# Patient Record
Sex: Female | Born: 1980 | Race: White | Hispanic: No | Marital: Married | Smoking: Former smoker
Health system: Southern US, Community
[De-identification: ages and names within clinical notes are randomized; demographics above are authoritative.]

## PROBLEM LIST (undated history)

## (undated) DIAGNOSIS — K219 Gastro-esophageal reflux disease without esophagitis: Secondary | ICD-10-CM

## (undated) DIAGNOSIS — F419 Anxiety disorder, unspecified: Secondary | ICD-10-CM

## (undated) DIAGNOSIS — T8859XA Other complications of anesthesia, initial encounter: Secondary | ICD-10-CM

## (undated) DIAGNOSIS — T4145XA Adverse effect of unspecified anesthetic, initial encounter: Secondary | ICD-10-CM

## (undated) HISTORY — PX: OTHER SURGICAL HISTORY: SHX169

## (undated) HISTORY — PX: ABDOMINAL HYSTERECTOMY: SHX81

## (undated) HISTORY — PX: KNEE SURGERY: SHX244

---

## 2005-08-28 ENCOUNTER — Ambulatory Visit: Payer: Self-pay | Admitting: Internal Medicine

## 2013-08-23 ENCOUNTER — Ambulatory Visit: Payer: Self-pay | Admitting: Physician Assistant

## 2017-04-21 ENCOUNTER — Encounter
Admission: RE | Admit: 2017-04-21 | Discharge: 2017-04-21 | Disposition: A | Discharge status: Home / Self Care | Payer: 59 | Source: Ambulatory Visit | Attending: Unknown Physician Specialty | Admitting: Unknown Physician Specialty

## 2017-04-21 ENCOUNTER — Other Ambulatory Visit: Payer: Self-pay

## 2017-04-21 HISTORY — DX: Anxiety disorder, unspecified: F41.9

## 2017-04-21 HISTORY — DX: Adverse effect of unspecified anesthetic, initial encounter: T41.45XA

## 2017-04-21 HISTORY — DX: Other complications of anesthesia, initial encounter: T88.59XA

## 2017-04-21 NOTE — Patient Instructions (Signed)
  Your procedure is scheduled on: 04-29-17  Report to Same Day Surgery 2nd floor medical mall [facility] Entrance-take elevator on left to 2nd floor.  Check in with surgery information desk.) To find out your arrival time please call [phone] between 1PM - 3PM on 04-28-17  Remember: Instructions that are not followed completely may result in serious medical risk, up to and including death, or upon the discretion of your surgeon and anesthesiologist your surgery may need to be rescheduled.    _x___ 1. Do not eat food after midnight the night before your procedure. NO GUM CHEWING OR CANDY AFTER MIDNIGHT.  You may drink clear liquids up to 2 hours before you are scheduled to arrive at the hospital for your procedure.  Do not drink clear liquids within 2 hours of your scheduled arrival to the hospital.  Clear liquids include  --Water or Apple juice without pulp  --Clear carbohydrate beverage such as ClearFast or Gatorade  --Black Coffee or Clear Tea (No milk, no creamers, do not add anything to the coffee or Tea        __x__ 2. No Alcohol for 24 hours before or after surgery.   __x__3. No Smoking for 24 prior to surgery.   ____  4. Bring all medications with you on the day of surgery if instructed.    __x__ 5. Notify your doctor if there is any change in your medical condition     (cold, fever, infections).     Do not wear jewelry, make-up, hairpins, clips or nail polish.  Do not wear lotions, powders, or perfumes. You may wear deodorant.  Do not shave 48 hours prior to surgery. Men may shave face and neck.  Do not bring valuables to the hospital.    [facility] is not responsible for any belongings or valuables.               Contacts, dentures or bridgework may not be worn into surgery.  Leave your suitcase in the car. After surgery it may be brought to your room.  For patients admitted to the hospital, discharge time is determined by your treatment team.   Patients  discharged the day of surgery will not be allowed to drive home.  You will need someone to drive you home and stay with you the night of your procedure.       ____ Take anti-hypertensive listed below, cardiac, seizure, asthma, anti-reflux and psychiatric medicines. These include:  1. NONE  2.  3.  4.  5.  6.  ____Fleets enema or Magnesium Citrate as directed.   ____ Use CHG Soap or sage wipes as directed on instruction sheet   ____ Use inhalers on the day of surgery and bring to hospital day of surgery  ____ Stop Metformin and Janumet 2 days prior to surgery.    ____ Take 1/2 of usual insulin dose the night before surgery and none on the morning     surgery.   ____ Follow recommendations from Cardiologist, Pulmonologist or PCP regarding stopping Aspirin, Coumadin, Plavix ,Eliquis, Effient, or Pradaxa, and Pletal.  X____Stop Anti-inflammatories such as Advil, Aleve, Ibuprofen, Motrin, Naproxen, Naprosyn, Goodies powders or aspirin products NOW-OK to take Tylenol   ____ Stop supplements until after surgery.     ____ Bring C-Pap to the hospital.

## 2017-04-29 ENCOUNTER — Encounter: Admission: RE | Payer: Self-pay | Source: Ambulatory Visit

## 2017-04-29 ENCOUNTER — Ambulatory Visit: Admission: RE | Admit: 2017-04-29 | Payer: 59 | Source: Ambulatory Visit | Admitting: Unknown Physician Specialty

## 2017-04-29 SURGERY — THYROIDECTOMY
Anesthesia: Choice | Laterality: Right

## 2017-06-22 ENCOUNTER — Other Ambulatory Visit: Payer: Self-pay

## 2017-06-22 ENCOUNTER — Ambulatory Visit
Admission: EM | Admit: 2017-06-22 | Discharge: 2017-06-22 | Disposition: A | Discharge status: Home / Self Care | Payer: Managed Care, Other (non HMO) | Attending: Emergency Medicine | Admitting: Emergency Medicine

## 2017-06-22 DIAGNOSIS — J029 Acute pharyngitis, unspecified: Secondary | ICD-10-CM | POA: Diagnosis not present

## 2017-06-22 DIAGNOSIS — R0982 Postnasal drip: Secondary | ICD-10-CM

## 2017-06-22 LAB — RAPID INFLUENZA A&B ANTIGENS (ARMC ONLY)
INFLUENZA A (ARMC): NEGATIVE
INFLUENZA B (ARMC): NEGATIVE

## 2017-06-22 LAB — RAPID STREP SCREEN (MED CTR MEBANE ONLY): Streptococcus, Group A Screen (Direct): NEGATIVE

## 2017-06-22 MED ORDER — IBUPROFEN 600 MG PO TABS: 600.0000 mg | ORAL_TABLET | Freq: Four times a day (QID) | ORAL | 0 refills | Status: DC | PRN

## 2017-06-22 MED ORDER — OSELTAMIVIR PHOSPHATE 75 MG PO CAPS: 75.0000 mg | ORAL_CAPSULE | Freq: Two times a day (BID) | ORAL | 0 refills | Status: DC

## 2017-06-22 MED ORDER — IPRATROPIUM BROMIDE 0.06 % NA SOLN: 2.0000 | Freq: Four times a day (QID) | NASAL | 0 refills | Status: DC

## 2017-06-22 NOTE — ED Provider Notes (Signed)
HPI  SUBJECTIVE:  Margaret Bradley is a 37 y.o. female who presents with 2 days of chills, body aches, fatigue, severe sore throat.  She has been taking Tylenol every 4-6 hours with some improvement in her symptoms.  There are no aggravating factors.  She reports sinus pressure, but no sinus pain or other headache.  No neck stiffness, photophobia, abdominal pain, rash, voice changes, drooling, trismus.  No sensation of throat swelling shut, difficulty breathing.  She states that she noticed exudates on her tonsils today.  No urinary complaints.  She did not get a flu shot this year.  She states that her son had a viral syndrome last week.  No GERD or allergy symptoms.  She reports clear nasal congestion, rhinorrhea, extensive postnasal drip for the past week.  She reports an occasional cough productive of greenish mucus.  No wheezing, shortness of breath.  She reports sinus pressure, but no pain.  No upper dental pain.  She has been taking Mucinex with improvement in her symptoms for this.  Her sinus pressure is not associated with bending forward or lying down.  Past medical history negative for sinusitis, allergies, diabetes, hypertension, mono, asthma, eczema, COPD, immunocompromise.  Past medical history of right-sided thyroid nodule.  LMP: One month ago.  Denies possibility of being pregnant.  RSW:NIOEVOJ, Jeneen Rinks, MD   Past Medical History:  Diagnosis Date  . Anxiety    OCC- HAS XANAX BUT HAS NEVER TAKEN  . Complication of anesthesia   . PONV (postoperative nausea and vomiting)     Past Surgical History:  Procedure Laterality Date  . KNEE SURGERY    . OTHER SURGICAL HISTORY     EGG RETRIEVAL    History reviewed. No pertinent family history.  Social History   Tobacco Use  . Smoking status: Former Smoker    Packs/day: 0.25    Years: 8.00    Pack years: 2.00    Types: Cigarettes    Last attempt to quit: 04/21/2016    Years since quitting: 1.1  . Smokeless tobacco: Never Used   Substance Use Topics  . Alcohol use: Yes    Frequency: Never    Comment: OCC  . Drug use: Yes    Types: Marijuana    Comment: OCC    No current facility-administered medications for this encounter.   Current Outpatient Medications:  .  drospirenone-ethinyl estradiol (YAZ) 3-0.02 MG tablet, Take 1 tablet daily by mouth., Disp: , Rfl:  .  ibuprofen (ADVIL,MOTRIN) 600 MG tablet, Take 1 tablet (600 mg total) by mouth every 6 (six) hours as needed., Disp: 30 tablet, Rfl: 0 .  ipratropium (ATROVENT) 0.06 % nasal spray, Place 2 sprays into both nostrils 4 (four) times daily. 3-4 times/ day, Disp: 15 mL, Rfl: 0 .  Multiple Vitamin (MULTIVITAMIN) tablet, Take 1 tablet daily by mouth. WHEN SHE REMEMBERS, Disp: , Rfl:  .  oseltamivir (TAMIFLU) 75 MG capsule, Take 1 capsule (75 mg total) by mouth 2 (two) times daily. X 5 days, Disp: 10 capsule, Rfl: 0 .  vitamin C (ASCORBIC ACID) 500 MG tablet, Take 500 mg daily by mouth. ONLY TAKING DUE TO COLD SYMPTOMS, Disp: , Rfl:   No Known Allergies   ROS  As noted in HPI.   Physical Exam  BP 107/66 (BP Location: Left Arm)   Pulse 89   Temp 99.1 F (37.3 C) (Oral)   Resp 18   Ht 5\' 8"  (1.727 m)   Wt 157 lb (71.2 kg)  LMP 05/22/2017   SpO2 100%   Breastfeeding? No   BMI 23.87 kg/m   Constitutional: Well developed, well nourished, no acute distress Eyes:  EOMI, conjunctiva normal bilaterally HENT: Normocephalic, atraumatic,mucus membranes moist.  TMs normal bilaterally.  Clear rhinorrhea.  Erythematous, not swollen turbinates.  No sinus tenderness.  Extensive postnasal drip and cobblestoning.  Erythematous oropharynx, tonsils normal size.  Positive scant exudates.  Uvula midline. Neck: Positive tender cervical lymphadenopathy bilaterally.  Positive nontender thyroid nodule in the right. Respiratory: Normal inspiratory effort, lungs clear bilaterally good air movement Cardiovascular: Normal rate regular rhythm, no murmurs, rubs, gallops. GI:  nondistended, no tenderness.  No splenomegaly.   Skin: No rash, skin intact Musculoskeletal: no deformities Neurologic: Alert & oriented x 3, no focal neuro deficits Psychiatric: Speech and behavior appropriate   ED Course   Medications - No data to display  Orders Placed This Encounter  Procedures  . Rapid strep screen    Standing Status:   Standing    Number of Occurrences:   1  . Rapid Influenza A&B Antigens ([facility] only)    Standing Status:   Standing    Number of Occurrences:   1  . Culture, group A strep    Standing Status:   Standing    Number of Occurrences:   1  . Droplet precaution    Standing Status:   Standing    Number of Occurrences:   1    Results for orders placed or performed during the hospital encounter of 06/22/17 (from the past 24 hour(s))  Rapid strep screen     Status: None   Collection Time: 06/22/17  1:53 PM  Result Value Ref Range   Streptococcus, Group A Screen (Direct) NEGATIVE NEGATIVE  Rapid Influenza A&B Antigens ([facility] only)     Status: None   Collection Time: 06/22/17  1:53 PM  Result Value Ref Range   Influenza A ([facility]) NEGATIVE NEGATIVE   Influenza B ([facility]) NEGATIVE NEGATIVE   No results found.  ED Clinical Impression  Acute pharyngitis, unspecified etiology  Post-nasal drip   ED Assessment/Plan   Patient with postnasal drip, she does not have any evidence of a sinusitis at this point in time that would require antibiotics.  Home with saline nasal irrigation, she is to restart his nasal spray, will start her on Atrovent as well.  Rapid strep and flu are negative.  Throat culture sent.  She also has an influenza-like illness starting 2 days ago.  Doubt meningitis.  No evidence of otitis.  Her rapid strep was negative, although she does have some exudates on her tonsils.  We will send home with a wait-and-see prescription of Tamiflu, ibuprofen 600 mg take with 1 g of Tylenol 3-4 times a day, push fluids, rest, advised  Benadryl/Maalox mixture.  She will follow-up with her primary care physician as needed.  To the ER if she gets worse.  Discussed labs, MDM, plan and followup with patient. patient agrees with plan.   Meds ordered this encounter  Medications  . ipratropium (ATROVENT) 0.06 % nasal spray    Sig: Place 2 sprays into both nostrils 4 (four) times daily. 3-4 times/ day    Dispense:  15 mL    Refill:  0  . ibuprofen (ADVIL,MOTRIN) 600 MG tablet    Sig: Take 1 tablet (600 mg total) by mouth every 6 (six) hours as needed.    Dispense:  30 tablet    Refill:  0  . oseltamivir (TAMIFLU)  75 MG capsule    Sig: Take 1 capsule (75 mg total) by mouth 2 (two) times daily. X 5 days    Dispense:  10 capsule    Refill:  0    *This clinic note was created using [employer]. Therefore, there may be occasional mistakes despite careful proofreading.   ?   Melynda Ripple, MD 06/22/17 1429

## 2017-06-22 NOTE — Discharge Instructions (Signed)
your rapid strep was negative today, so we have sent off a throat culture.  We will contact you and call in the appropriate antibiotics if your culture comes back positive for an infection requiring antibiotic treatment.  Give [place] a working phone number.  1 gram of Tylenol and 600 mg ibuprofen together 3-4 times a day as needed for pain.  Make sure you drink plenty of extra fluids.  Some people find salt water gargles and  Traditional Medicinal's "Throat Coat" tea helpful. Take 5 mL of liquid Benadryl and 5 mL of Maalox. Mix it together, and then hold it in your mouth for as long as you can and then swallow. You may do this 4 times a day.  Consider starting the Tamiflu if you start having fevers.  You have 48-72 hours within the onset of symptoms to start it for maximal efficacy.  Start some saline nasal irrigation with a Nettie pot or Milta Deiters med sinus rinse in addition to restarting the Flonase and Atrovent. May continue mucinex or mucinex d for the nasal congestion.  Go to www.goodrx.com to look up your medications. This will give you a list of where you can find your prescriptions at the most affordable prices. Or ask the pharmacist what the cash price is, or if they have any other discount programs available to help make your medication more affordable. This can be less expensive than what you would pay with insurance.

## 2017-06-22 NOTE — ED Triage Notes (Signed)
One week of nasal drainage, sore throat, bodyaches, low energy. Pain 5/10. Hasn't checked her temp so unsure if she has a fever.

## 2017-06-25 ENCOUNTER — Telehealth: Payer: Self-pay | Admitting: *Deleted

## 2017-06-25 ENCOUNTER — Telehealth: Payer: Self-pay

## 2017-06-25 LAB — CULTURE, GROUP A STREP (THRC)

## 2017-06-25 NOTE — Telephone Encounter (Signed)
Patient called in reporting worsening symptoms of fever, sore throat, and severe sinus pressure since visit on 06/22/17. I informed the patient that her strep culture has not resulted at this time and that she will be called when results are available. Advised patient to follow up with PCP if symptoms become worse or do not improve. Patient reported having a visit scheduled with her PCP today.

## 2017-06-25 NOTE — Telephone Encounter (Signed)
Pt called stating she wanted her throat culture result I told her it was negative and she said she wasn't feeling better and if I was sure? She then requested to speak to a nurse and I let kim, RN speak with her regarding her concern.

## 2017-07-29 ENCOUNTER — Other Ambulatory Visit: Payer: Managed Care, Other (non HMO)

## 2017-08-26 ENCOUNTER — Encounter
Admission: RE | Admit: 2017-08-26 | Discharge: 2017-08-26 | Disposition: A | Discharge status: Home / Self Care | Payer: Managed Care, Other (non HMO) | Source: Ambulatory Visit | Attending: Unknown Physician Specialty | Admitting: Unknown Physician Specialty

## 2017-08-26 ENCOUNTER — Other Ambulatory Visit: Payer: Self-pay

## 2017-08-26 NOTE — Patient Instructions (Signed)
Your procedure is scheduled on: 09-02-17 TUESDAY Report to Same Day Surgery 2nd floor medical mall [facility] Entrance-take elevator on left to 2nd floor.  Check in with surgery information desk.) To find out your arrival time please call [phone] between 1PM - 3PM on 09-01-17 MONDAY  Remember: Instructions that are not followed completely may result in serious medical risk, up to and including death, or upon the discretion of your surgeon and anesthesiologist your surgery may need to be rescheduled.    _x___ 1. Do not eat food after midnight the night before your procedure. You may drink clear liquids up to 2 hours before you are scheduled to arrive at the hospital for your procedure.  Do not drink clear liquids within 2 hours of your scheduled arrival to the hospital.  Clear liquids include  --Water or Apple juice without pulp  --Clear carbohydrate beverage such as ClearFast or Gatorade  --Black Coffee or Clear Tea (No milk, no creamers, do not add anything to the coffee or Tea Type 1 and type 2 diabetics should only drink water.  No gum chewing or hard candies.     __x__ 2. No Alcohol for 24 hours before or after surgery.   __x__3. No Smoking or e-cigarettes for 24 prior to surgery.  Do not use any chewable tobacco products for at least 6 hour prior to surgery   ____  4. Bring all medications with you on the day of surgery if instructed.    __x__ 5. Notify your doctor if there is any change in your medical condition     (cold, fever, infections).    x___6. On the morning of surgery brush your teeth with toothpaste and water.  You may rinse your mouth with mouth wash if you wish.  Do not swallow any toothpaste or mouthwash.   Do not wear jewelry, make-up, hairpins, clips or nail polish.  Do not wear lotions, powders, or perfumes. You may wear deodorant.  Do not shave 48 hours prior to surgery. Men may shave face and neck.  Do not bring valuables to the hospital.    [facility] is not responsible for any belongings or valuables.               Contacts, dentures or bridgework may not be worn into surgery.  Leave your suitcase in the car. After surgery it may be brought to your room.  For patients admitted to the hospital, discharge time is determined by your treatment team.  _  Patients discharged the day of surgery will not be allowed to drive home.  You will need someone to drive you home and stay with you the night of your procedure.    Please read over the following fact sheets that you were given:   [facility] Preparing for Surgery and or MRSA Information   ____ Take anti-hypertensive listed below, cardiac, seizure, asthma,     anti-reflux and psychiatric medicines. These include:  1. NONE  2.  3.  4.  5.  6.  ____Fleets enema or Magnesium Citrate as directed.   ____ Use CHG Soap or sage wipes as directed on instruction sheet   ____ Use inhalers on the day of surgery and bring to hospital day of surgery  ____ Stop Metformin and Janumet 2 days prior to surgery.    ____ Take 1/2 of usual insulin dose the night before surgery and none on the morning surgery.   ____ Follow recommendations from Cardiologist, Pulmonologist or  PCP regarding  stopping Aspirin, Coumadin, Plavix ,Eliquis, Effient, or Pradaxa, and Pletal.  X____Stop Anti-inflammatories such as Advil, Aleve, Ibuprofen, Motrin, Naproxen, Naprosyn, Goodies powders or aspirin products NOW-OK to take Tylenol    ____ Stop supplements until after surgery.     ____ Bring C-Pap to the hospital.

## 2017-09-02 ENCOUNTER — Encounter
Admission: RE | Disposition: A | Discharge status: Home / Self Care | Payer: Self-pay | Source: Ambulatory Visit | Attending: Unknown Physician Specialty

## 2017-09-02 ENCOUNTER — Ambulatory Visit: Payer: Managed Care, Other (non HMO) | Admitting: Anesthesiology

## 2017-09-02 ENCOUNTER — Ambulatory Visit
Admission: RE | Admit: 2017-09-02 | Discharge: 2017-09-02 | Disposition: A | Discharge status: Home / Self Care | Payer: Managed Care, Other (non HMO) | Source: Ambulatory Visit | Attending: Unknown Physician Specialty | Admitting: Unknown Physician Specialty

## 2017-09-02 ENCOUNTER — Encounter: Payer: Self-pay | Admitting: *Deleted

## 2017-09-02 DIAGNOSIS — Z87891 Personal history of nicotine dependence: Secondary | ICD-10-CM | POA: Insufficient documentation

## 2017-09-02 DIAGNOSIS — E049 Nontoxic goiter, unspecified: Secondary | ICD-10-CM | POA: Insufficient documentation

## 2017-09-02 DIAGNOSIS — Z793 Long term (current) use of hormonal contraceptives: Secondary | ICD-10-CM | POA: Diagnosis not present

## 2017-09-02 DIAGNOSIS — Z79899 Other long term (current) drug therapy: Secondary | ICD-10-CM | POA: Insufficient documentation

## 2017-09-02 DIAGNOSIS — E0789 Other specified disorders of thyroid: Secondary | ICD-10-CM | POA: Diagnosis present

## 2017-09-02 HISTORY — PX: THYROIDECTOMY: SHX17

## 2017-09-02 LAB — URINE DRUG SCREEN, QUALITATIVE (ARMC ONLY)
Amphetamines, Ur Screen: NOT DETECTED
BARBITURATES, UR SCREEN: NOT DETECTED
BENZODIAZEPINE, UR SCRN: NOT DETECTED
Cannabinoid 50 Ng, Ur ~~LOC~~: POSITIVE — AB
Cocaine Metabolite,Ur ~~LOC~~: NOT DETECTED
MDMA (Ecstasy)Ur Screen: NOT DETECTED
METHADONE SCREEN, URINE: NOT DETECTED
OPIATE, UR SCREEN: NOT DETECTED
Phencyclidine (PCP) Ur S: NOT DETECTED
Tricyclic, Ur Screen: NOT DETECTED

## 2017-09-02 LAB — POCT PREGNANCY, URINE: Preg Test, Ur: NEGATIVE

## 2017-09-02 SURGERY — THYROIDECTOMY
Anesthesia: General | Laterality: Right

## 2017-09-02 MED ORDER — OXYCODONE HCL 5 MG/5ML PO SOLN
5.0000 mg | Freq: Once | ORAL | Status: AC | PRN
  Administered 2017-09-02: 5 mg via ORAL

## 2017-09-02 MED ORDER — DIPHENHYDRAMINE HCL 50 MG/ML IJ SOLN
INTRAMUSCULAR | Status: AC
  Filled 2017-09-02: qty 1

## 2017-09-02 MED ORDER — PROPOFOL 500 MG/50ML IV EMUL
INTRAVENOUS | Status: DC | PRN
  Administered 2017-09-02: 150 ug/kg/min via INTRAVENOUS

## 2017-09-02 MED ORDER — LACTATED RINGERS IV SOLN
INTRAVENOUS | Status: DC
  Administered 2017-09-02: 07:00:00 via INTRAVENOUS

## 2017-09-02 MED ORDER — HYDROMORPHONE HCL 1 MG/ML IJ SOLN
0.2500 mg | INTRAMUSCULAR | Status: DC | PRN
  Administered 2017-09-02 (×2): 0.5 mg via INTRAVENOUS

## 2017-09-02 MED ORDER — DIPHENHYDRAMINE HCL 50 MG/ML IJ SOLN
INTRAMUSCULAR | Status: DC | PRN
  Administered 2017-09-02: 12.5 mg via INTRAVENOUS

## 2017-09-02 MED ORDER — SUCCINYLCHOLINE CHLORIDE 20 MG/ML IJ SOLN
INTRAMUSCULAR | Status: DC | PRN
  Administered 2017-09-02: 140 mg via INTRAVENOUS

## 2017-09-02 MED ORDER — MIDAZOLAM HCL 2 MG/2ML IJ SOLN
INTRAMUSCULAR | Status: AC
  Filled 2017-09-02: qty 2

## 2017-09-02 MED ORDER — ONDANSETRON HCL 4 MG/2ML IJ SOLN
INTRAMUSCULAR | Status: AC
  Filled 2017-09-02: qty 2

## 2017-09-02 MED ORDER — DEXAMETHASONE SODIUM PHOSPHATE 10 MG/ML IJ SOLN
INTRAMUSCULAR | Status: DC | PRN
  Administered 2017-09-02: 5 mg via INTRAVENOUS

## 2017-09-02 MED ORDER — LIDOCAINE HCL (PF) 2 % IJ SOLN
INTRAMUSCULAR | Status: AC
  Filled 2017-09-02: qty 10

## 2017-09-02 MED ORDER — FENTANYL CITRATE (PF) 100 MCG/2ML IJ SOLN
INTRAMUSCULAR | Status: AC
  Filled 2017-09-02: qty 2

## 2017-09-02 MED ORDER — SUCCINYLCHOLINE CHLORIDE 20 MG/ML IJ SOLN
INTRAMUSCULAR | Status: AC
  Filled 2017-09-02: qty 1

## 2017-09-02 MED ORDER — DEXAMETHASONE SODIUM PHOSPHATE 10 MG/ML IJ SOLN
INTRAMUSCULAR | Status: AC
  Filled 2017-09-02: qty 1

## 2017-09-02 MED ORDER — REMIFENTANIL HCL 1 MG IV SOLR
INTRAVENOUS | Status: DC | PRN
  Administered 2017-09-02: .5 ug/kg/min via INTRAVENOUS

## 2017-09-02 MED ORDER — PROPOFOL 500 MG/50ML IV EMUL
INTRAVENOUS | Status: AC
Start: 2017-09-02 — End: 2017-09-02
  Filled 2017-09-02: qty 50

## 2017-09-02 MED ORDER — PHENYLEPHRINE HCL 10 MG/ML IJ SOLN
INTRAMUSCULAR | Status: DC | PRN
  Administered 2017-09-02: 100 ug via INTRAVENOUS

## 2017-09-02 MED ORDER — ONDANSETRON HCL 4 MG/2ML IJ SOLN
INTRAMUSCULAR | Status: DC | PRN
Start: 2017-09-02 — End: 2017-09-02
  Administered 2017-09-02: 4 mg via INTRAVENOUS

## 2017-09-02 MED ORDER — GLYCOPYRROLATE 0.2 MG/ML IJ SOLN
INTRAMUSCULAR | Status: DC | PRN
  Administered 2017-09-02 (×2): 0.1 mg via INTRAVENOUS

## 2017-09-02 MED ORDER — LIDOCAINE-EPINEPHRINE 1 %-1:100000 IJ SOLN
INTRAMUSCULAR | Status: AC
  Filled 2017-09-02: qty 1

## 2017-09-02 MED ORDER — FAMOTIDINE 20 MG PO TABS
20.0000 mg | ORAL_TABLET | Freq: Once | ORAL | Status: AC
  Administered 2017-09-02: 20 mg via ORAL

## 2017-09-02 MED ORDER — MIDAZOLAM HCL 2 MG/2ML IJ SOLN
INTRAMUSCULAR | Status: DC | PRN
Start: 2017-09-02 — End: 2017-09-02
  Administered 2017-09-02: 2 mg via INTRAVENOUS

## 2017-09-02 MED ORDER — FENTANYL CITRATE (PF) 100 MCG/2ML IJ SOLN
INTRAMUSCULAR | Status: DC | PRN
  Administered 2017-09-02: 100 ug via INTRAVENOUS

## 2017-09-02 MED ORDER — LIDOCAINE HCL (CARDIAC) 20 MG/ML IV SOLN
INTRAVENOUS | Status: DC | PRN
  Administered 2017-09-02: 100 mg via INTRAVENOUS

## 2017-09-02 MED ORDER — MEPERIDINE HCL 50 MG/ML IJ SOLN: 6.2500 mg | INTRAMUSCULAR | Status: DC | PRN

## 2017-09-02 MED ORDER — OXYCODONE HCL 5 MG PO TABS: 5.0000 mg | ORAL_TABLET | Freq: Once | ORAL | Status: AC | PRN

## 2017-09-02 MED ORDER — GLYCOPYRROLATE 0.2 MG/ML IJ SOLN
INTRAMUSCULAR | Status: AC
  Filled 2017-09-02: qty 1

## 2017-09-02 MED ORDER — REMIFENTANIL HCL 1 MG IV SOLR
INTRAVENOUS | Status: AC
  Filled 2017-09-02: qty 1000

## 2017-09-02 MED ORDER — PROPOFOL 10 MG/ML IV BOLUS
INTRAVENOUS | Status: DC | PRN
  Administered 2017-09-02: 180 mg via INTRAVENOUS

## 2017-09-02 MED ORDER — SODIUM CHLORIDE 0.9 % IV SOLN
INTRAVENOUS | Status: DC | PRN
  Administered 2017-09-02: 50 ug/min via INTRAVENOUS

## 2017-09-02 MED ORDER — LIDOCAINE-EPINEPHRINE 1 %-1:100000 IJ SOLN
INTRAMUSCULAR | Status: DC | PRN
  Administered 2017-09-02: 5 mL

## 2017-09-02 MED ORDER — EPHEDRINE SULFATE 50 MG/ML IJ SOLN
INTRAMUSCULAR | Status: AC
  Filled 2017-09-02: qty 1

## 2017-09-02 MED ORDER — PROPOFOL 500 MG/50ML IV EMUL
INTRAVENOUS | Status: AC
  Filled 2017-09-02: qty 50

## 2017-09-02 MED ORDER — FAMOTIDINE 20 MG PO TABS
ORAL_TABLET | ORAL | Status: AC
  Filled 2017-09-02: qty 1

## 2017-09-02 MED ORDER — OXYCODONE-ACETAMINOPHEN 10-325 MG PO TABS: 1.0000 | ORAL_TABLET | Freq: Four times a day (QID) | ORAL | 0 refills | Status: DC | PRN

## 2017-09-02 MED ORDER — HYDROMORPHONE HCL 1 MG/ML IJ SOLN
INTRAMUSCULAR | Status: DC
Start: 2017-09-02 — End: 2017-09-02
  Filled 2017-09-02: qty 1

## 2017-09-02 MED ORDER — PROMETHAZINE HCL 25 MG/ML IJ SOLN: 6.2500 mg | INTRAMUSCULAR | Status: DC | PRN

## 2017-09-02 MED ORDER — EPHEDRINE SULFATE 50 MG/ML IJ SOLN
INTRAMUSCULAR | Status: DC | PRN
  Administered 2017-09-02: 5 mg via INTRAVENOUS

## 2017-09-02 MED ORDER — OXYCODONE HCL 5 MG/5ML PO SOLN
ORAL | Status: AC
  Filled 2017-09-02: qty 5

## 2017-09-02 SURGICAL SUPPLY — 36 items
BLADE SURG 15 STRL LF DISP TIS (BLADE) ×1 IMPLANT
BLADE SURG 15 STRL SS (BLADE) ×2
CANISTER SUCT 1200ML W/VALVE (MISCELLANEOUS) ×3 IMPLANT
CORD BIP STRL DISP 12FT (MISCELLANEOUS) ×3 IMPLANT
DERMABOND ADVANCED (GAUZE/BANDAGES/DRESSINGS) ×2
DERMABOND ADVANCED .7 DNX12 (GAUZE/BANDAGES/DRESSINGS) ×1 IMPLANT
DRAIN TLS ROUND 10FR (DRAIN) IMPLANT
DRAPE MAG INST 16X20 L/F (DRAPES) ×3 IMPLANT
DRSG TEGADERM 2-3/8X2-3/4 SM (GAUZE/BANDAGES/DRESSINGS) ×3 IMPLANT
ELECT LARYNGEAL 6/7 (MISCELLANEOUS) ×3
ELECT LARYNGEAL 8/9 (MISCELLANEOUS)
ELECT REM PT RETURN 9FT ADLT (ELECTROSURGICAL) ×3
ELECTRODE LARYNGEAL 6/7 (MISCELLANEOUS) ×1 IMPLANT
ELECTRODE LARYNGEAL 8/9 (MISCELLANEOUS) IMPLANT
ELECTRODE REM PT RTRN 9FT ADLT (ELECTROSURGICAL) ×1 IMPLANT
FORCEPS JEWEL BIP 4-3/4 STR (INSTRUMENTS) ×3 IMPLANT
GLOVE BIO SURGEON STRL SZ7.5 (GLOVE) ×6 IMPLANT
GOWN STRL REUS W/ TWL LRG LVL3 (GOWN DISPOSABLE) ×3 IMPLANT
GOWN STRL REUS W/TWL LRG LVL3 (GOWN DISPOSABLE) ×6
HEMOSTAT SURGICEL 2X3 (HEMOSTASIS) ×3 IMPLANT
HOOK STAY BLUNT/RETRACTOR 5M (MISCELLANEOUS) ×3 IMPLANT
KIT TURNOVER KIT A (KITS) ×3 IMPLANT
LABEL OR SOLS (LABEL) IMPLANT
NS IRRIG 500ML POUR BTL (IV SOLUTION) ×3 IMPLANT
PACK HEAD/NECK (MISCELLANEOUS) ×3 IMPLANT
PROBE NEUROSIGN BIPOL (MISCELLANEOUS) ×1 IMPLANT
PROBE NEUROSIGN BIPOLAR (MISCELLANEOUS) ×2
SHEARS HARMONIC 9CM CVD (BLADE) ×3 IMPLANT
SPONGE KITTNER 5P (MISCELLANEOUS) ×3 IMPLANT
SPONGE XRAY 4X4 16PLY STRL (MISCELLANEOUS) ×3 IMPLANT
STAPLER SKIN PROX 35W (STAPLE) ×3 IMPLANT
SUT SILK 2 0 (SUTURE) ×2
SUT SILK 2 0 SH (SUTURE) ×3 IMPLANT
SUT SILK 2-0 18XBRD TIE 12 (SUTURE) ×1 IMPLANT
SUT VIC AB 4-0 RB1 18 (SUTURE) ×6 IMPLANT
SYSTEM CHEST DRAIN TLS 7FR (DRAIN) IMPLANT

## 2017-09-02 NOTE — Anesthesia Procedure Notes (Addendum)
Procedure Name: Intubation Date/Time: 09/02/2017 7:26 AM Performed by: Alphonsus Sias, MD Pre-anesthesia Checklist: Patient identified, Emergency Drugs available, Suction available, Patient being monitored and Timeout performed Patient Re-evaluated:Patient Re-evaluated prior to induction Oxygen Delivery Method: Circle system utilized Preoxygenation: Pre-oxygenation with 100% oxygen Induction Type: IV induction Ventilation: Mask ventilation without difficulty Laryngoscope Size: McGraph and 3 Grade View: Grade I Tube type: Oral Tube size: 7.0 mm Number of attempts: 1 Airway Equipment and Method: Stylet Placement Confirmation: ETT inserted through vocal cords under direct vision,  positive ETCO2 and breath sounds checked- equal and bilateral Secured at: 21 cm Tube secured with: Tape Dental Injury: Teeth and Oropharynx as per pre-operative assessment  Comments: ETT with neurosign monitor attached

## 2017-09-02 NOTE — Anesthesia Preprocedure Evaluation (Signed)
Anesthesia Evaluation  Patient identified by MRN, date of birth, ID band Patient awake    Reviewed: Allergy & Precautions, H&P , NPO status , reviewed documented beta blocker date and time   History of Anesthesia Complications (+) PONV and history of anesthetic complications  Airway Mallampati: II  TM Distance: >3 FB     Dental no notable dental hx. (+) Teeth Intact   Pulmonary former smoker,    Pulmonary exam normal        Cardiovascular negative cardio ROS Normal cardiovascular exam     Neuro/Psych PSYCHIATRIC DISORDERS Anxiety    GI/Hepatic Neg liver ROS, neg GERD  ,  Endo/Other  Thyroid Cyst  Renal/GU negative Renal ROS  negative genitourinary   Musculoskeletal negative musculoskeletal ROS (+)   Abdominal   Peds negative pediatric ROS (+)  Hematology negative hematology ROS (+)   Anesthesia Other Findings   Reproductive/Obstetrics negative OB ROS                             Anesthesia Physical Anesthesia Plan  ASA: II  Anesthesia Plan: General ETT   Post-op Pain Management:    Induction:   PONV Risk Score and Plan: 3 and Ondansetron and Midazolam  Airway Management Planned:   Additional Equipment:   Intra-op Plan:   Post-operative Plan:   Informed Consent: I have reviewed the patients History and Physical, chart, labs and discussed the procedure including the risks, benefits and alternatives for the proposed anesthesia with the patient or authorized representative who has indicated his/her understanding and acceptance.   Dental Advisory Given  Plan Discussed with: CRNA  Anesthesia Plan Comments:         Anesthesia Quick Evaluation

## 2017-09-02 NOTE — Op Note (Signed)
09/02/2017  8:31 AM    Margaret Bradley  [id number]   Pre-Op Dx: THYROID CYST/ MASS  Post-op Dx: SAME  Proc: Right hemithyroidectomy; laryngeal nerve monitoring 1 hour   Surg:  Roena Malady   Asst.:Juengel  Anes:  GOT  EBL:  Less than 20 cc  Comp:  None  Findings:  Approximately 4 cm cystic mass right lobe of thyroid  Procedure: Margaret Bradley was identified in the holding area taken the operating room placed in supine position. After general endotracheal anesthesia with a laryngeal monitoring endotracheal tube and incision line was marked over the midline in a natural skin crease. A local anesthetic of 1% lidocaine with 1-100,000 units of epinephrine was used to inject along the incision line a total of 5 cc was used. The neck was then prepped and draped sterilely. A 15 blade was used to incise down to and through the platysma muscle hemostasis was achieved using the Bovie cautery. The midline raphae was identified and opened using the Harmonic scalpel. There was a cystic mass identified in the right lobe of thyroid. The strap muscles were retracted laterally several small feeding vessels were divided using the Harmonic scalpel. The superior pole of the gland was isolated and divided using the Harmonic scalpel. The gland was then retracted medially there are multiple feeding vessels which were isolated and divided using the Harmonic scalpel. The superior and inferior parathyroid glands were identified and left intact on the vascular pedicles. The recurrent laryngeal nerve was identified in the tracheoesophageal groove this was stimulated dissected free from surrounding tissue and remained intact throughout the procedure. The gland was then retracted medially the inferior vasculature was isolated and divided using the Harmonic scalpel berry's ligament was identified and divided using Harmonic scalpel this allowed the gland to peel over the anterior wall the trachea. Just past midline the gland  was divided in the isthmus using the Harmonic scalpel. Thus removing the entire right lobe and the large cystic mass. The wounds and cups irrigated with saline there was no evidence of bleeding. The nerve was stimulated in the case and remained intact. Surgicel was then placed in the neurovascular beds the excision site was so dry felt that a drain was not needed. The strap muscles were reapproximated in the midline using 4-0 Vicryl the platysma layer was closed using 4-0 Vicryl the subcutaneous tissues were closed using 4-0 Vicryl and the skin was closed using Dermabond. The patient was return anesthesia where she was awakened in the operating room taken recovery room stable condition.  Cultures: None  Specimens: Right lobe thyroid  Dispo:   Good  Plan:  Discharge to home follow-up 1 week  Roena Malady  09/02/2017 8:31 AM

## 2017-09-02 NOTE — Anesthesia Post-op Follow-up Note (Signed)
Anesthesia QCDR form completed.        

## 2017-09-02 NOTE — Anesthesia Postprocedure Evaluation (Signed)
Anesthesia Post Note  Patient: Margaret Bradley  Procedure(s) Performed: RIGHT HEMI-THYROIDECTOMY (Right )  Patient location during evaluation: PACU Anesthesia Type: General Level of consciousness: awake and alert Pain management: pain level controlled Vital Signs Assessment: post-procedure vital signs reviewed and stable Respiratory status: spontaneous breathing, nonlabored ventilation and respiratory function stable Cardiovascular status: blood pressure returned to baseline and stable Postop Assessment: no apparent nausea or vomiting Anesthetic complications: no     Last Vitals:  Vitals:   09/02/17 0925 09/02/17 0943  BP: 105/74 113/62  Pulse: 64 65  Resp: 12 16  Temp: 37.3 C 36.8 C  SpO2: 95% 100%    Last Pain:  Vitals:   09/02/17 0943  TempSrc: Temporal  PainSc: 5                  Amaranta Mehl Harvie Heck

## 2017-09-02 NOTE — Transfer of Care (Signed)
Immediate Anesthesia Transfer of Care Note  Patient: Margaret Bradley  Procedure(s) Performed: RIGHT HEMI-THYROIDECTOMY (Right )  Patient Location: PACU  Anesthesia Type:General  Level of Consciousness: awake, alert  and oriented  Airway & Oxygen Therapy: Patient Spontanous Breathing and Patient connected to face mask oxygen  Post-op Assessment: Report given to RN and Post -op Vital signs reviewed and stable  Post vital signs: Reviewed and stable  Last Vitals:  Vitals Value Taken Time  BP 121/78 09/02/2017  8:40 AM  Temp 36.1 C 09/02/2017  8:40 AM  Pulse 86 09/02/2017  8:42 AM  Resp 12 09/02/2017  8:42 AM  SpO2 100 % 09/02/2017  8:42 AM  Vitals shown include unvalidated device data.  Last Pain:  Vitals:   09/02/17 0840  TempSrc: Tympanic  PainSc:          Complications: No apparent anesthesia complications

## 2017-09-02 NOTE — H&P (Signed)
The patient's history has been reviewed, patient examined, no change in status, stable for surgery.  Questions were answered to the patients satisfaction.  

## 2017-09-02 NOTE — Discharge Instructions (Signed)

## 2017-09-04 LAB — SURGICAL PATHOLOGY

## 2018-04-10 ENCOUNTER — Other Ambulatory Visit: Payer: Managed Care, Other (non HMO)

## 2018-05-05 ENCOUNTER — Encounter
Admission: RE | Admit: 2018-05-05 | Discharge: 2018-05-05 | Disposition: A | Discharge status: Home / Self Care | Payer: Managed Care, Other (non HMO) | Source: Ambulatory Visit | Attending: Obstetrics & Gynecology | Admitting: Obstetrics & Gynecology

## 2018-05-05 ENCOUNTER — Other Ambulatory Visit: Payer: Self-pay

## 2018-05-05 HISTORY — DX: Gastro-esophageal reflux disease without esophagitis: K21.9

## 2018-05-05 NOTE — Patient Instructions (Signed)
Your procedure is scheduled on: 05-15-18 FRIDAY Report to Same Day Surgery 2nd floor medical mall [facility] Entrance-take elevator on left to 2nd floor.  Check in with surgery information desk.) To find out your arrival time please call [phone] between 1PM - 3PM on 05-14-18 THURSDAY  Remember: Instructions that are not followed completely may result in serious medical risk, up to and including death, or upon the discretion of your surgeon and anesthesiologist your surgery may need to be rescheduled.    _x___ 1. Do not eat food after midnight the night before your procedure. NO GUM OR CANDY AFTER MIDNIGHT.  You may drink clear liquids up to 2 hours before you are scheduled to arrive at the hospital for your procedure.  Do not drink clear liquids within 2 hours of your scheduled arrival to the hospital.  Clear liquids include  --Water or Apple juice without pulp  --Clear carbohydrate beverage such as ClearFast or Gatorade  --Black Coffee or Clear Tea (No milk, no creamers, do not add anything to the coffee or Tea   ____Ensure clear carbohydrate drink on the way to the hospital for bariatric patients  ____Ensure clear carbohydrate drink 3 hours before surgery for Dr Dwyane Luo patients if physician instructed.     __x__ 2. No Alcohol for 24 hours before or after surgery.   __x__3. No Smoking or e-cigarettes for 24 prior to surgery.  Do not use any chewable tobacco products for at least 6 hour prior to surgery   ____  4. Bring all medications with you on the day of surgery if instructed.    __x__ 5. Notify your doctor if there is any change in your medical condition     (cold, fever, infections).    x___6. On the morning of surgery brush your teeth with toothpaste and water.  You may rinse your mouth with mouth wash if you wish.  Do not swallow any toothpaste or mouthwash.   Do not wear jewelry, make-up, hairpins, clips or nail polish.  Do not wear lotions, powders, or perfumes.  You may wear deodorant.  Do not shave 48 hours prior to surgery. Men may shave face and neck.  Do not bring valuables to the hospital.    [facility] is not responsible for any belongings or valuables.               Contacts, dentures or bridgework may not be worn into surgery.  Leave your suitcase in the car. After surgery it may be brought to your room.  For patients admitted to the hospital, discharge time is determined by your treatment team.  _  Patients discharged the day of surgery will not be allowed to drive home.  You will need someone to drive you home and stay with you the night of your procedure.    Please read over the following fact sheets that you were given:   [facility] Preparing for Surgery   ____ Take anti-hypertensive listed below, cardiac, seizure, asthma, anti-reflux and psychiatric medicines. These include:  1. NONE  2.  3.  4.  5.  6.  ____Fleets enema or Magnesium Citrate as directed.   _x___ Use CHG Soap or sage wipes as directed on instruction sheet   ____ Use inhalers on the day of surgery and bring to hospital day of surgery  ____ Stop Metformin and Janumet 2 days prior to surgery.    ____ Take 1/2 of usual insulin dose the night before surgery and none  on the morning surgery.   ____ Follow recommendations from Cardiologist, Pulmonologist or PCP regarding stopping Aspirin, Coumadin, Plavix ,Eliquis, Effient, or Pradaxa, and Pletal.  X____Stop Anti-inflammatories such as Advil, Aleve, Ibuprofen, Motrin, Naproxen, Naprosyn, Goodies powders or aspirin products NOW-OK to take Tylenol   _X___ Stop supplements until after surgery-STOP VITAMIN C NOW-MAY RESUME AFTER SURGERY   ____ Bring C-Pap to the hospital.

## 2018-05-12 ENCOUNTER — Encounter
Admission: RE | Admit: 2018-05-12 | Discharge: 2018-05-12 | Disposition: A | Discharge status: Home / Self Care | Payer: Managed Care, Other (non HMO) | Source: Ambulatory Visit | Attending: Obstetrics & Gynecology | Admitting: Obstetrics & Gynecology

## 2018-05-12 DIAGNOSIS — D259 Leiomyoma of uterus, unspecified: Secondary | ICD-10-CM | POA: Insufficient documentation

## 2018-05-12 DIAGNOSIS — Z01818 Encounter for other preprocedural examination: Secondary | ICD-10-CM

## 2018-05-12 LAB — BASIC METABOLIC PANEL
Anion gap: 9 (ref 5–15)
BUN: 10 mg/dL (ref 6–20)
CHLORIDE: 102 mmol/L (ref 98–111)
CO2: 26 mmol/L (ref 22–32)
CREATININE: 0.84 mg/dL (ref 0.44–1.00)
Calcium: 8.7 mg/dL — ABNORMAL LOW (ref 8.9–10.3)
GFR calc non Af Amer: >60 mL/min (ref >60)
Glucose, Bld: 104 mg/dL — ABNORMAL HIGH (ref 70–99)
POTASSIUM: 4.1 mmol/L (ref 3.5–5.1)
Sodium: 137 mmol/L (ref 135–145)

## 2018-05-12 LAB — CBC
HEMATOCRIT: 43 % (ref 36.0–46.0)
Hemoglobin: 14 g/dL (ref 12.0–15.0)
MCH: 28.7 pg (ref 26.0–34.0)
MCHC: 32.6 g/dL (ref 30.0–36.0)
MCV: 88.3 fL (ref 80.0–100.0)
Platelets: 315 10*3/uL (ref 150–400)
RBC: 4.87 MIL/uL (ref 3.87–5.11)
RDW: 12.2 % (ref 11.5–15.5)
WBC: 9 10*3/uL (ref 4.0–10.5)
nRBC: 0 % (ref 0.0–0.2)

## 2018-05-14 ENCOUNTER — Encounter: Payer: Self-pay | Admitting: *Deleted

## 2018-05-14 LAB — TYPE AND SCREEN
ABO/RH(D): O POS
Antibody Screen: NEGATIVE

## 2018-05-14 MED ORDER — CEFAZOLIN SODIUM-DEXTROSE 2-4 GM/100ML-% IV SOLN
2.0000 g | Freq: Once | INTRAVENOUS | Status: AC
  Administered 2018-05-15 (×2): 2 g via INTRAVENOUS

## 2018-05-15 ENCOUNTER — Inpatient Hospital Stay
Admission: RE | Admit: 2018-05-15 | Discharge: 2018-05-16 | DRG: 743 | Disposition: A | Discharge status: Home / Self Care | Payer: Managed Care, Other (non HMO) | Attending: Obstetrics & Gynecology | Admitting: Obstetrics & Gynecology

## 2018-05-15 ENCOUNTER — Encounter
Admission: RE | Disposition: A | Discharge status: Home / Self Care | Payer: Self-pay | Source: Home / Self Care | Attending: Obstetrics & Gynecology

## 2018-05-15 ENCOUNTER — Other Ambulatory Visit: Payer: Self-pay

## 2018-05-15 ENCOUNTER — Inpatient Hospital Stay: Payer: Managed Care, Other (non HMO) | Admitting: Anesthesiology

## 2018-05-15 ENCOUNTER — Encounter: Payer: Self-pay | Admitting: *Deleted

## 2018-05-15 DIAGNOSIS — K4020 Bilateral inguinal hernia, without obstruction or gangrene, not specified as recurrent: Secondary | ICD-10-CM | POA: Diagnosis present

## 2018-05-15 DIAGNOSIS — D259 Leiomyoma of uterus, unspecified: Secondary | ICD-10-CM | POA: Diagnosis present

## 2018-05-15 DIAGNOSIS — Z87891 Personal history of nicotine dependence: Secondary | ICD-10-CM

## 2018-05-15 DIAGNOSIS — D252 Subserosal leiomyoma of uterus: Secondary | ICD-10-CM | POA: Diagnosis present

## 2018-05-15 DIAGNOSIS — Z23 Encounter for immunization: Secondary | ICD-10-CM | POA: Diagnosis not present

## 2018-05-15 DIAGNOSIS — Z90710 Acquired absence of both cervix and uterus: Secondary | ICD-10-CM | POA: Diagnosis present

## 2018-05-15 HISTORY — PX: ROBOTIC ASSISTED TOTAL HYSTERECTOMY WITH SALPINGECTOMY: SHX6679

## 2018-05-15 LAB — URINE DRUG SCREEN, QUALITATIVE (ARMC ONLY)
AMPHETAMINES, UR SCREEN: NOT DETECTED
BENZODIAZEPINE, UR SCRN: NOT DETECTED
Barbiturates, Ur Screen: NOT DETECTED
Cannabinoid 50 Ng, Ur ~~LOC~~: POSITIVE — AB
Cocaine Metabolite,Ur ~~LOC~~: NOT DETECTED
MDMA (Ecstasy)Ur Screen: NOT DETECTED
METHADONE SCREEN, URINE: NOT DETECTED
Opiate, Ur Screen: NOT DETECTED
PHENCYCLIDINE (PCP) UR S: NOT DETECTED
Tricyclic, Ur Screen: NOT DETECTED

## 2018-05-15 LAB — ABO/RH: ABO/RH(D): O POS

## 2018-05-15 LAB — POCT PREGNANCY, URINE: Preg Test, Ur: NEGATIVE

## 2018-05-15 SURGERY — ROBOTIC ASSISTED TOTAL HYSTERECTOMY WITH SALPINGECTOMY
Anesthesia: General | Laterality: Bilateral

## 2018-05-15 MED ORDER — PROMETHAZINE HCL 25 MG/ML IJ SOLN: 6.2500 mg | INTRAMUSCULAR | Status: DC | PRN

## 2018-05-15 MED ORDER — HEPARIN SODIUM (PORCINE) 5000 UNIT/ML IJ SOLN
INTRAMUSCULAR | Status: AC
  Filled 2018-05-15: qty 1

## 2018-05-15 MED ORDER — SUGAMMADEX SODIUM 200 MG/2ML IV SOLN
INTRAVENOUS | Status: DC | PRN
  Administered 2018-05-15: 150 mg via INTRAVENOUS

## 2018-05-15 MED ORDER — CEFAZOLIN SODIUM-DEXTROSE 2-4 GM/100ML-% IV SOLN
INTRAVENOUS | Status: AC
  Filled 2018-05-15: qty 100

## 2018-05-15 MED ORDER — PROPOFOL 10 MG/ML IV BOLUS
INTRAVENOUS | Status: DC | PRN
  Administered 2018-05-15: 150 mg via INTRAVENOUS

## 2018-05-15 MED ORDER — KETOROLAC TROMETHAMINE 30 MG/ML IJ SOLN
30.0000 mg | Freq: Once | INTRAMUSCULAR | Status: AC
  Administered 2018-05-15: 30 mg via INTRAVENOUS

## 2018-05-15 MED ORDER — ONDANSETRON HCL 4 MG/2ML IJ SOLN
INTRAMUSCULAR | Status: DC | PRN
  Administered 2018-05-15: 4 mg via INTRAVENOUS

## 2018-05-15 MED ORDER — ONDANSETRON HCL 4 MG/2ML IJ SOLN: 4.0000 mg | Freq: Four times a day (QID) | INTRAMUSCULAR | Status: DC | PRN

## 2018-05-15 MED ORDER — FAMOTIDINE 20 MG PO TABS
20.0000 mg | ORAL_TABLET | Freq: Once | ORAL | Status: AC
  Administered 2018-05-15: 20 mg via ORAL

## 2018-05-15 MED ORDER — LIDOCAINE HCL (PF) 2 % IJ SOLN
INTRAMUSCULAR | Status: AC
  Filled 2018-05-15: qty 10

## 2018-05-15 MED ORDER — HYDROMORPHONE HCL 1 MG/ML IJ SOLN: 0.2000 mg | INTRAMUSCULAR | Status: DC | PRN

## 2018-05-15 MED ORDER — DOCUSATE SODIUM 100 MG PO CAPS
100.0000 mg | ORAL_CAPSULE | Freq: Two times a day (BID) | ORAL | Status: DC
  Administered 2018-05-15 – 2018-05-16 (×3): 100 mg via ORAL
  Filled 2018-05-15 (×3): qty 1

## 2018-05-15 MED ORDER — ACETAMINOPHEN 500 MG PO TABS
ORAL_TABLET | ORAL | Status: AC
  Filled 2018-05-15: qty 2

## 2018-05-15 MED ORDER — SUGAMMADEX SODIUM 200 MG/2ML IV SOLN
INTRAVENOUS | Status: AC
  Filled 2018-05-15: qty 2

## 2018-05-15 MED ORDER — AMMONIA AROMATIC IN INHA
RESPIRATORY_TRACT | Status: AC
  Filled 2018-05-15: qty 10

## 2018-05-15 MED ORDER — BUPIVACAINE LIPOSOME 1.3 % IJ SUSP
INTRAMUSCULAR | Status: DC | PRN
  Administered 2018-05-15: 20 mL

## 2018-05-15 MED ORDER — OXYCODONE HCL 5 MG PO TABS: 10.0000 mg | ORAL_TABLET | ORAL | Status: DC | PRN

## 2018-05-15 MED ORDER — MIDAZOLAM HCL 2 MG/2ML IJ SOLN
INTRAMUSCULAR | Status: DC | PRN
  Administered 2018-05-15: 2 mg via INTRAVENOUS

## 2018-05-15 MED ORDER — GLYCOPYRROLATE 0.2 MG/ML IJ SOLN
INTRAMUSCULAR | Status: AC
  Filled 2018-05-15: qty 1

## 2018-05-15 MED ORDER — SODIUM CHLORIDE 0.9 % IV SOLN
50.0000 mL/h | INTRAVENOUS | Status: DC
  Administered 2018-05-15: 50 mL/h via INTRAVENOUS

## 2018-05-15 MED ORDER — OXYCODONE HCL 5 MG PO TABS: 5.0000 mg | ORAL_TABLET | Freq: Once | ORAL | Status: DC | PRN

## 2018-05-15 MED ORDER — LIDOCAINE HCL (CARDIAC) PF 100 MG/5ML IV SOSY
PREFILLED_SYRINGE | INTRAVENOUS | Status: DC | PRN
  Administered 2018-05-15: 80 mg via INTRAVENOUS

## 2018-05-15 MED ORDER — SEVOFLURANE IN SOLN
RESPIRATORY_TRACT | Status: AC
  Filled 2018-05-15: qty 250

## 2018-05-15 MED ORDER — FENTANYL CITRATE (PF) 100 MCG/2ML IJ SOLN
INTRAMUSCULAR | Status: DC | PRN
  Administered 2018-05-15: 25 ug via INTRAVENOUS
  Administered 2018-05-15 (×2): 50 ug via INTRAVENOUS

## 2018-05-15 MED ORDER — FAMOTIDINE 20 MG PO TABS
ORAL_TABLET | ORAL | Status: AC
  Filled 2018-05-15: qty 1

## 2018-05-15 MED ORDER — ACETAMINOPHEN 500 MG PO TABS
1000.0000 mg | ORAL_TABLET | Freq: Once | ORAL | Status: AC
  Administered 2018-05-15: 1000 mg via ORAL

## 2018-05-15 MED ORDER — KETOROLAC TROMETHAMINE 30 MG/ML IJ SOLN
INTRAMUSCULAR | Status: AC
  Filled 2018-05-15: qty 1

## 2018-05-15 MED ORDER — ROCURONIUM BROMIDE 50 MG/5ML IV SOLN
INTRAVENOUS | Status: AC
  Filled 2018-05-15: qty 1

## 2018-05-15 MED ORDER — DEXAMETHASONE SODIUM PHOSPHATE 10 MG/ML IJ SOLN
INTRAMUSCULAR | Status: AC
  Filled 2018-05-15: qty 1

## 2018-05-15 MED ORDER — HEPARIN SODIUM (PORCINE) 5000 UNIT/ML IJ SOLN
5000.0000 [IU] | Freq: Once | INTRAMUSCULAR | Status: AC
  Administered 2018-05-15: 5000 [IU] via SUBCUTANEOUS

## 2018-05-15 MED ORDER — LACTATED RINGERS IV SOLN
INTRAVENOUS | Status: DC
  Administered 2018-05-15: 07:00:00 via INTRAVENOUS

## 2018-05-15 MED ORDER — OXYCODONE HCL 5 MG PO TABS: 5.0000 mg | ORAL_TABLET | ORAL | Status: DC | PRN

## 2018-05-15 MED ORDER — BUPIVACAINE LIPOSOME 1.3 % IJ SUSP
INTRAMUSCULAR | Status: AC
  Filled 2018-05-15: qty 20

## 2018-05-15 MED ORDER — SILVER NITRATE-POT NITRATE 75-25 % EX MISC
CUTANEOUS | Status: AC
  Filled 2018-05-15: qty 4

## 2018-05-15 MED ORDER — CELECOXIB 200 MG PO CAPS
ORAL_CAPSULE | ORAL | Status: AC
  Filled 2018-05-15: qty 2

## 2018-05-15 MED ORDER — ACETAMINOPHEN 500 MG PO TABS
1000.0000 mg | ORAL_TABLET | Freq: Four times a day (QID) | ORAL | Status: DC
  Administered 2018-05-15 – 2018-05-16 (×4): 1000 mg via ORAL
  Filled 2018-05-15 (×4): qty 2

## 2018-05-15 MED ORDER — CELECOXIB 200 MG PO CAPS
400.0000 mg | ORAL_CAPSULE | Freq: Once | ORAL | Status: AC
  Administered 2018-05-15: 400 mg via ORAL

## 2018-05-15 MED ORDER — FENTANYL CITRATE (PF) 250 MCG/5ML IJ SOLN
INTRAMUSCULAR | Status: AC
  Filled 2018-05-15: qty 5

## 2018-05-15 MED ORDER — SUCCINYLCHOLINE CHLORIDE 20 MG/ML IJ SOLN
INTRAMUSCULAR | Status: AC
  Filled 2018-05-15: qty 1

## 2018-05-15 MED ORDER — CEFAZOLIN SODIUM 1 G IJ SOLR
INTRAMUSCULAR | Status: AC
  Filled 2018-05-15: qty 10

## 2018-05-15 MED ORDER — MIDAZOLAM HCL 2 MG/2ML IJ SOLN
INTRAMUSCULAR | Status: AC
  Filled 2018-05-15: qty 2

## 2018-05-15 MED ORDER — GABAPENTIN 300 MG PO CAPS
600.0000 mg | ORAL_CAPSULE | Freq: Once | ORAL | Status: AC
  Administered 2018-05-15: 600 mg via ORAL

## 2018-05-15 MED ORDER — ONDANSETRON HCL 4 MG/2ML IJ SOLN
INTRAMUSCULAR | Status: AC
  Filled 2018-05-15: qty 2

## 2018-05-15 MED ORDER — PHENYLEPHRINE HCL 10 MG/ML IJ SOLN
INTRAMUSCULAR | Status: AC
  Filled 2018-05-15: qty 1

## 2018-05-15 MED ORDER — GABAPENTIN 300 MG PO CAPS
ORAL_CAPSULE | ORAL | Status: AC
  Filled 2018-05-15: qty 2

## 2018-05-15 MED ORDER — MEPERIDINE HCL 50 MG/ML IJ SOLN: 6.2500 mg | INTRAMUSCULAR | Status: DC | PRN

## 2018-05-15 MED ORDER — SIMETHICONE 80 MG PO CHEW
160.0000 mg | CHEWABLE_TABLET | Freq: Four times a day (QID) | ORAL | Status: DC | PRN
  Administered 2018-05-15 – 2018-05-16 (×2): 160 mg via ORAL
  Filled 2018-05-15 (×2): qty 2

## 2018-05-15 MED ORDER — DEXAMETHASONE SODIUM PHOSPHATE 10 MG/ML IJ SOLN
INTRAMUSCULAR | Status: DC | PRN
  Administered 2018-05-15: 5 mg via INTRAVENOUS

## 2018-05-15 MED ORDER — ONDANSETRON HCL 4 MG PO TABS: 4.0000 mg | ORAL_TABLET | Freq: Four times a day (QID) | ORAL | Status: DC | PRN

## 2018-05-15 MED ORDER — FENTANYL CITRATE (PF) 100 MCG/2ML IJ SOLN: 25.0000 ug | INTRAMUSCULAR | Status: DC | PRN

## 2018-05-15 MED ORDER — IBUPROFEN 600 MG PO TABS
600.0000 mg | ORAL_TABLET | Freq: Four times a day (QID) | ORAL | Status: DC
  Administered 2018-05-15 – 2018-05-16 (×3): 600 mg via ORAL
  Filled 2018-05-15 (×3): qty 1

## 2018-05-15 MED ORDER — MENTHOL 3 MG MT LOZG: 1.0000 | LOZENGE | OROMUCOSAL | Status: DC | PRN

## 2018-05-15 MED ORDER — KETOROLAC TROMETHAMINE 30 MG/ML IJ SOLN
INTRAMUSCULAR | Status: AC
  Administered 2018-05-15: 30 mg via INTRAVENOUS
  Filled 2018-05-15: qty 1

## 2018-05-15 MED ORDER — PROPOFOL 10 MG/ML IV BOLUS
INTRAVENOUS | Status: AC
  Filled 2018-05-15: qty 20

## 2018-05-15 MED ORDER — GABAPENTIN 300 MG PO CAPS
900.0000 mg | ORAL_CAPSULE | Freq: Every day | ORAL | Status: DC
  Administered 2018-05-15: 900 mg via ORAL
  Filled 2018-05-15: qty 3

## 2018-05-15 MED ORDER — ROCURONIUM BROMIDE 100 MG/10ML IV SOLN
INTRAVENOUS | Status: DC | PRN
  Administered 2018-05-15: 50 mg via INTRAVENOUS
  Administered 2018-05-15: 20 mg via INTRAVENOUS
  Administered 2018-05-15: 10 mg via INTRAVENOUS

## 2018-05-15 MED ORDER — OXYCODONE HCL 5 MG/5ML PO SOLN: 5.0000 mg | Freq: Once | ORAL | Status: DC | PRN

## 2018-05-15 SURGICAL SUPPLY — 77 items
BAG URINE DRAINAGE (UROLOGICAL SUPPLIES) ×3 IMPLANT
BLADE SURG SZ11 CARB STEEL (BLADE) ×33 IMPLANT
CANISTER SUCT 1200ML W/VALVE (MISCELLANEOUS) ×3 IMPLANT
CANNULA SEAL DVNC (CANNULA) ×1 IMPLANT
CANNULA SEALS DA VINCI (CANNULA) ×2
CATH FOLEY 2WAY  5CC 16FR (CATHETERS) ×2
CATH URTH 16FR FL 2W BLN LF (CATHETERS) ×1 IMPLANT
CHLORAPREP W/TINT 26ML (MISCELLANEOUS) ×3 IMPLANT
CORD BIP STRL DISP 12FT (MISCELLANEOUS) ×3 IMPLANT
COVER TIP SHEARS 8 DVNC (MISCELLANEOUS) ×1 IMPLANT
COVER TIP SHEARS 8MM DA VINCI (MISCELLANEOUS) ×2
COVER WAND RF STERILE (DRAPES) ×3 IMPLANT
CRADLE LAMINECT ARM (MISCELLANEOUS) ×3 IMPLANT
DEFOGGER SCOPE WARMER CLEARIFY (MISCELLANEOUS) ×3 IMPLANT
DERMABOND ADVANCED (GAUZE/BANDAGES/DRESSINGS) ×2
DERMABOND ADVANCED .7 DNX12 (GAUZE/BANDAGES/DRESSINGS) ×1 IMPLANT
DRAPE 3 ARM ACCESS DA VINCI (DRAPES) ×2
DRAPE 3 ARM ACCESS DVNC (DRAPES) ×1 IMPLANT
DRAPE LEGGINS SURG 28X43 STRL (DRAPES) ×3 IMPLANT
DRAPE SHEET LG 3/4 BI-LAMINATE (DRAPES) ×9 IMPLANT
DRAPE UNDER BUTTOCK W/FLU (DRAPES) ×3 IMPLANT
DRIVER NDL 23- D MEGA 49.7X1.4 (INSTRUMENTS) ×1 IMPLANT
DRVR NDL 23- D MEGA 49.7X1.4 (INSTRUMENTS) ×1
ELECT REM PT RETURN 9FT ADLT (ELECTROSURGICAL) ×3
ELECTRODE REM PT RTRN 9FT ADLT (ELECTROSURGICAL) ×1 IMPLANT
FILTER LAP SMOKE EVAC STRL (MISCELLANEOUS) ×3 IMPLANT
GAUZE SPONGE 4X4 16PLY XRAY LF (GAUZE/BANDAGES/DRESSINGS) ×3 IMPLANT
GLOVE PI ORTHOPRO 6.5 (GLOVE) ×2
GLOVE PI ORTHOPRO STRL 6.5 (GLOVE) ×1 IMPLANT
GLOVE SURG SYN 6.5 ES PF (GLOVE) ×24 IMPLANT
GOWN STRL REUS W/ TWL LRG LVL3 (GOWN DISPOSABLE) ×8 IMPLANT
GOWN STRL REUS W/TWL LRG LVL3 (GOWN DISPOSABLE) ×16
GRASPER SUT TROCAR 14GX15 (MISCELLANEOUS) ×3 IMPLANT
HANDLE YANKAUER SUCT BULB TIP (MISCELLANEOUS) ×3 IMPLANT
IRRIGATION STRYKERFLOW (MISCELLANEOUS) ×1 IMPLANT
IRRIGATOR STRYKERFLOW (MISCELLANEOUS) ×3
IV NS 1000ML (IV SOLUTION) ×2
IV NS 1000ML BAXH (IV SOLUTION) ×1 IMPLANT
KIT PINK PAD W/HEAD ARE REST (MISCELLANEOUS) ×3
KIT PINK PAD W/HEAD ARM REST (MISCELLANEOUS) ×1 IMPLANT
KIT TURNOVER CYSTO (KITS) ×3 IMPLANT
LABEL OR SOLS (LABEL) ×3 IMPLANT
MANIPULATOR VCARE LG CRV RETR (MISCELLANEOUS) ×3 IMPLANT
MANIPULATOR VCARE SML CRV RETR (MISCELLANEOUS) IMPLANT
MANIPULATOR VCARE STD CRV RETR (MISCELLANEOUS) IMPLANT
NDL INSUFF 14G 150MM VS150000 (NEEDLE) ×3 IMPLANT
NEEDLE HYPO 22GX1.5 SAFETY (NEEDLE) ×3 IMPLANT
NEEDLE VERESS 14GA 120MM (NEEDLE) ×3 IMPLANT
NS IRRIG 1000ML POUR BTL (IV SOLUTION) ×3 IMPLANT
PACK LAP CHOLECYSTECTOMY (MISCELLANEOUS) ×3 IMPLANT
PAD OB MATERNITY 4.3X12.25 (PERSONAL CARE ITEMS) ×3 IMPLANT
PAD PREP 24X41 OB/GYN DISP (PERSONAL CARE ITEMS) ×3 IMPLANT
PENCIL ELECTRO HAND CTR (MISCELLANEOUS) ×3 IMPLANT
RETRACTOR RING XSMALL (MISCELLANEOUS) ×1 IMPLANT
RTRCTR WOUND ALEXIS 13CM XS SH (MISCELLANEOUS) ×3
SOLUTION ELECTROLUBE (MISCELLANEOUS) ×3 IMPLANT
SUT CUT NEEDLE DRIVER (INSTRUMENTS) ×2
SUT DVC VLOC 180 0 12IN GS21 (SUTURE) ×3
SUT ETHIBOND NAB CT1 #1 30IN (SUTURE) ×3 IMPLANT
SUT MNCRL 4-0 (SUTURE) ×2
SUT MNCRL 4-0 27XMFL (SUTURE) ×1
SUT VIC AB 0 CT1 36 (SUTURE) ×3 IMPLANT
SUT VIC AB 0 UR5 27 (SUTURE) IMPLANT
SUT VIC AB 1 CT1 36 (SUTURE) IMPLANT
SUT VIC AB 2-0 CT1 27 (SUTURE) ×2
SUT VIC AB 2-0 CT1 TAPERPNT 27 (SUTURE) ×1 IMPLANT
SUT VIC AB 3-0 SH 27 (SUTURE) ×2
SUT VIC AB 3-0 SH 27X BRD (SUTURE) ×1 IMPLANT
SUT VICRYL 0 AB UR-6 (SUTURE) IMPLANT
SUTURE DVC VLC 180 0 12IN GS21 (SUTURE) ×1 IMPLANT
SUTURE MNCRL 4-0 27XMF (SUTURE) ×1 IMPLANT
SYR 10ML LL (SYRINGE) ×3 IMPLANT
TROCAR BALLN GELPORT 12X130M (ENDOMECHANICALS) IMPLANT
TROCAR DISP BLADELESS 8 DVNC (TROCAR) ×1 IMPLANT
TROCAR DISP BLADELESS 8MM (TROCAR) ×2
TROCAR ENDO BLADELESS 11MM (ENDOMECHANICALS) ×3 IMPLANT
TUBING INSUF HEATED (TUBING) ×3 IMPLANT

## 2018-05-15 NOTE — Anesthesia Procedure Notes (Signed)
Procedure Name: Intubation Date/Time: 05/15/2018 8:06 AM Performed by: Zetta Bills, CRNA Pre-anesthesia Checklist: Patient identified, Emergency Drugs available, Suction available and Patient being monitored Patient Re-evaluated:Patient Re-evaluated prior to induction Oxygen Delivery Method: Circle system utilized Preoxygenation: Pre-oxygenation with 100% oxygen Induction Type: IV induction Ventilation: Mask ventilation without difficulty Laryngoscope Size: Mac and 3 Grade View: Grade I Tube type: Oral Tube size: 7.0 mm Number of attempts: 1 Airway Equipment and Method: Stylet Placement Confirmation: ETT inserted through vocal cords under direct vision Secured at: 20 cm Tube secured with: Tape Dental Injury: Teeth and Oropharynx as per pre-operative assessment

## 2018-05-15 NOTE — Op Note (Addendum)
05/15/2018  PATIENT:  Margaret Bradley  37 y.o. female  PRE-OPERATIVE DIAGNOSIS:  fibroid uterus  POST-OPERATIVE DIAGNOSIS:  fibroid uterus  PROCEDURE:  Procedure(s): ROBOTIC ASSISTED SUPRACERVICAL HYSTERECTOMY WITH BILATERAL SALPINGECTOMY (Bilateral)  >250g With hand-morcellation  SURGEON:  Surgeon(s) and Role:    * Desean Heemstra, Honor Loh, MD - Primary    Benjaman Kindler, MD - Assisting  ANESTHESIA: GET  EBL:  Total I/O In: 700 [I.V.:700] Out: 450 [Urine:200; Blood:250]  DRAINS: foley to gravity (removed in OR)  SPECIMEN: Uterus, bilateral tubes  DISPOSITION OF SPECIMEN:  To pathology  COUNTS: correct x2  FINDINGS:  1. 20week fibroid uterus, multiple subserosal fibroids 2. Bilateral inguinal hernias  3. Normal upper abdomen and prominent spleen.  COMPLICATIONS: none apparent  PATIENT DISPOSITION:  VS stable to PACU    Indication for surgery: Patient had presented with complaints of painful, symptomatic and enlarging fibroid uterus.  Various treatment options were discussed and patient requested a hysterectomy to resolve her symptoms. Due to her medical co-morbidities, a robotic approach was preferred.  Risks benefits and alternatives were reviewed and informed consent was obtained.   Procedure: The patient was brought to the OR and identified as Margaret Bradley.  She was given general anesthesia via endotracheal route.  Nasogastric tube was placed.  She was then positioned in the dorsal lithotomy position and prepped and draped in the usual sterile fashion.  A surgical time-out was called. A foley catheter was placed.  A speculum was placed in the vagina and the cervix was visualized, grasped with a single tooth tenaculum and the V-Care uterine manipulator was placed in and around the cervix.  After a change of gloves, the attention was turned to the abdomen.  A midline incision was made approximately 3 cm above the umbilicus.  The subcutaneous tissues were dissected, the  fascia was divided, the peritoneum entered, and a balloon trochar was inserted.  Pneumoperitoneum was created to 58mmHg.  Two Robotic trochars and one 2mm assist trochar were inserted atraumatically under visualization.  The patient was placed in steep trendelenburg, and the daVinci robot was docked and a monopolor scissors and bipolar fenestrated grasper were employed.  A brief survey of the upper abdomen was performed.  The attention was turned to the pelvis.  The fibroid uterus was obscuring most of the visual field.  Slow and careful manipulation was required. The bilateral round ligaments were cauterized and transected.  The anterior peritoneum was divided and a bladder flap was created.  The bilateral mesosalpinx were divided, and utero-ovarian ligaments and vessels were cauterized and transected.  Bilateral ureters were identified.  The posterior broad ligament was divided and the bilateral uterine arteries were skeletonized, sealed, and divided.  The uterus was transected from the cervical stump using monopolar cautery.  The uterus and bilateral tubes were grasped with a laparoscopic grasper through the suprapubic assist port.  The robotic instruments were removed and the robot undocked.      An Alexis retractor was placed in the suprapubic port after it was enlarged to 3cm. Using the ExCITE manual morcellation method, the uterus and bilateral tubes were removed, without loss of grasp of the tissue.  The fascia of the incision was closed with 0-vicryl once the retractor was removed.  The camera was reinserted and the surgical site re-examined. All surgical sites were hemostatic. Bilateral ureters were seen vermiculating.  The remaining trochars were removed and the midline fascia was closed using 0-Vicryl.  The skin of all incision were  closed with 4-0 monocryl and covered with surgical glue.  The procedure was then deemed complete.    The sponge, needle, and instrument counts were correct x2.  The  patient tolerated reversal of anesthesia, and was brought to the PACU in a stable condition.  Due to the unusual anatomy and very large uterus, in order to be completed laparoscopically and safely morcellated, additional time and effort was needed to complete this case.   I was present and performed this case in its entirety. Larey Days, MD Attending Obstetrician and Gynecologist [facility]

## 2018-05-15 NOTE — Anesthesia Postprocedure Evaluation (Signed)
Anesthesia Post Note  Patient: Margaret Bradley  Procedure(s) Performed: ROBOTIC ASSISTED SUPRACERVICAL HYSTERECTOMY WITH BILATERAL SALPINGECTOMY (Bilateral )  Patient location during evaluation: PACU Anesthesia Type: General Level of consciousness: awake and alert and oriented Pain management: pain level controlled Vital Signs Assessment: post-procedure vital signs reviewed and stable Respiratory status: spontaneous breathing, nonlabored ventilation and respiratory function stable Cardiovascular status: blood pressure returned to baseline and stable Postop Assessment: no signs of nausea or vomiting Anesthetic complications: no     Last Vitals:  Vitals:   05/15/18 1323 05/15/18 1401  BP: 100/61 110/71  Pulse: 75 81  Resp: 16 16  Temp: 36.7 C 36.7 C  SpO2: 98% 98%    Last Pain:  Vitals:   05/15/18 1401  TempSrc: Oral  PainSc:                  Margaret Bradley

## 2018-05-15 NOTE — Anesthesia Preprocedure Evaluation (Signed)
Anesthesia Evaluation  Patient identified by MRN, date of birth, ID band Patient awake    Reviewed: Allergy & Precautions, NPO status , Patient's Chart, lab work & pertinent test results  History of Anesthesia Complications Negative for: history of anesthetic complications  Airway Mallampati: II  TM Distance: >3 FB Neck ROM: Full    Dental no notable dental hx.    Pulmonary neg sleep apnea, neg COPD, former smoker,    breath sounds clear to auscultation- rhonchi (-) wheezing      Cardiovascular Exercise Tolerance: Good (-) hypertension(-) CAD, (-) Past MI, (-) Cardiac Stents and (-) CABG  Rhythm:Regular Rate:Normal - Systolic murmurs and - Diastolic murmurs    Neuro/Psych Anxiety negative neurological ROS     GI/Hepatic Neg liver ROS, GERD  ,  Endo/Other  negative endocrine ROSneg diabetes  Renal/GU negative Renal ROS     Musculoskeletal negative musculoskeletal ROS (+)   Abdominal (+) - obese,   Peds  Hematology negative hematology ROS (+)   Anesthesia Other Findings Past Medical History: No date: Anxiety     Comment:  OCC- HAS XANAX BUT HAS NEVER TAKEN No date: Complication of anesthesia     Comment:  PT WAS AGITATED DURING EGG RETRIEVAL  No date: GERD (gastroesophageal reflux disease)     Comment:  occ-no meds   Reproductive/Obstetrics                             Anesthesia Physical Anesthesia Plan  ASA: II  Anesthesia Plan: General   Post-op Pain Management:    Induction: Intravenous  PONV Risk Score and Plan: 2 and Ondansetron, Dexamethasone and Midazolam  Airway Management Planned: Oral ETT  Additional Equipment:   Intra-op Plan:   Post-operative Plan: Extubation in OR  Informed Consent: I have reviewed the patients History and Physical, chart, labs and discussed the procedure including the risks, benefits and alternatives for the proposed anesthesia with the  patient or authorized representative who has indicated his/her understanding and acceptance.   Dental advisory given  Plan Discussed with: CRNA and Anesthesiologist  Anesthesia Plan Comments:         Anesthesia Quick Evaluation

## 2018-05-15 NOTE — Transfer of Care (Signed)
Immediate Anesthesia Transfer of Care Note  Patient: Margaret Bradley  Procedure(s) Performed: ROBOTIC ASSISTED SUPRACERVICAL HYSTERECTOMY WITH BILATERAL SALPINGECTOMY (Bilateral )  Patient Location: PACU  Anesthesia Type:General  Level of Consciousness: awake  Airway & Oxygen Therapy: Patient connected to face mask oxygen  Post-op Assessment: Report given to RN  Post vital signs: stable  Last Vitals:  Vitals Value Taken Time  BP 104/61 05/15/2018 12:23 PM  Temp 36.5 C 05/15/2018 12:23 PM  Pulse 76 05/15/2018 12:30 PM  Resp 12 05/15/2018 12:30 PM  SpO2 100 % 05/15/2018 12:30 PM  Vitals shown include unvalidated device data.  Last Pain:  Vitals:   05/15/18 1223  TempSrc:   PainSc: Asleep         Complications: No apparent anesthesia complications

## 2018-05-15 NOTE — Anesthesia Post-op Follow-up Note (Signed)
Anesthesia QCDR form completed.        

## 2018-05-15 NOTE — H&P (Signed)
Preoperative History and Physical  Margaret Bradley is a 37 y.o. G1P0 here for surgical management of symptomatic fibroid uterus. She also has a history of endometriosis.   Last ultrasound showed: Uterus: 18 x 16 x 10cm with EE of 1cm LO: 3.5 x 4 x 5cm RO: 3 x 1.5 x 1.5cm Multiple large fibroids:  Fundal 8x8cm Fundal 8x7cm Right Ant; 6x5cm Left Ant: 4x5cm    Proposed surgery: robotic assisted laparoscopic supracervical hysterectomy, bilateral salpingectomy  Past Medical History:  Diagnosis Date  . Anxiety    OCC- HAS XANAX BUT HAS NEVER TAKEN  . Complication of anesthesia    PT WAS AGITATED DURING EGG RETRIEVAL   . GERD (gastroesophageal reflux disease)    occ-no meds   Past Surgical History:  Procedure Laterality Date  . KNEE SURGERY    . OTHER SURGICAL HISTORY     EGG RETRIEVAL  . THYROIDECTOMY Right 09/02/2017   Procedure: RIGHT HEMI-THYROIDECTOMY;  Surgeon: Beverly Gust, MD;  Location: [facility] ORS;  Service: ENT;  Laterality: Right;   OB History  Gravida Para Term Preterm AB Living  1            SAB TAB Ectopic Multiple Live Births               # Outcome Date GA Lbr Len/2nd Weight Sex Delivery Anes PTL Lv  1 Gravida           Patient denies any other pertinent gynecologic issues.   No current facility-administered medications on file prior to encounter.    Current Outpatient Medications on File Prior to Encounter  Medication Sig Dispense Refill  . drospirenone-ethinyl estradiol (YAZ) 3-0.02 MG tablet Take 1 tablet daily by mouth.    Marland Kitchen ibuprofen (ADVIL,MOTRIN) 200 MG tablet Take 600 mg by mouth daily as needed for moderate pain.    Marland Kitchen oxymetazoline (AFRIN) 0.05 % nasal spray Place 1 spray into both nostrils daily as needed for congestion.    . vitamin C (ASCORBIC ACID) 500 MG tablet Take 500 mg by mouth as needed (cold).     Marland Kitchen oxyCODONE-acetaminophen (PERCOCET) 10-325 MG tablet Take 1 tablet by mouth every 6 (six) hours as needed for pain. (Patient not taking:  Reported on 05/04/2018) 30 tablet 0   No Known Allergies  Social History:   reports that she quit smoking about 2 years ago. Her smoking use included cigarettes. She has a 2.00 pack-year smoking history. She has never used smokeless tobacco. She reports that she drinks alcohol. She reports that she has current or past drug history. Drug: Marijuana.  History reviewed. No pertinent family history.  Review of Systems: all systems reviewed and negative except per HPI  PHYSICAL EXAM: Blood pressure 108/73, pulse 79, temperature (!) 97.1 F (36.2 C), temperature source Oral, resp. rate 16, height 5\' 8"  (1.727 m), weight 73.9 kg, last menstrual period 04/18/2018, SpO2 99 %. General appearance - alert, well appearing, and in no distress Chest - clear to auscultation, no wheezes, rales or rhonchi, symmetric air entry Heart - normal rate and regular rhythm Abdomen - soft, nontender, nondistended, no masses or organomegaly Pelvic - examination not indicated Extremities - peripheral pulses normal, no pedal edema, no clubbing or cyanosis  Labs: Results for orders placed or performed during the hospital encounter of 05/15/18 (from the past 336 hour(s))  Pregnancy, urine POC   Collection Time: 05/15/18  6:25 AM  Result Value Ref Range   Preg Test, Ur NEGATIVE NEGATIVE  Results for orders  placed or performed during the hospital encounter of 05/12/18 (from the past 336 hour(s))  Basic metabolic panel   Collection Time: 05/12/18  9:10 AM  Result Value Ref Range   Sodium 137 135 - 145 mmol/L   Potassium 4.1 3.5 - 5.1 mmol/L   Chloride 102 98 - 111 mmol/L   CO2 26 22 - 32 mmol/L   Glucose, Bld 104 (H) 70 - 99 mg/dL   BUN 10 6 - 20 mg/dL   Creatinine, Ser 0.84 0.44 - 1.00 mg/dL   Calcium 8.7 (L) 8.9 - 10.3 mg/dL   GFR calc non Af Amer >60 >60 mL/min   GFR calc Af Amer >60 >60 mL/min   Anion gap 9 5 - 15  CBC   Collection Time: 05/12/18  9:10 AM  Result Value Ref Range   WBC 9.0 4.0 - 10.5 K/uL    RBC 4.87 3.87 - 5.11 MIL/uL   Hemoglobin 14.0 12.0 - 15.0 g/dL   HCT 43.0 36.0 - 46.0 %   MCV 88.3 80.0 - 100.0 fL   MCH 28.7 26.0 - 34.0 pg   MCHC 32.6 30.0 - 36.0 g/dL   RDW 12.2 11.5 - 15.5 %   Platelets 315 150 - 400 K/uL   nRBC 0.0 0.0 - 0.2 %  Type and screen [place]   Collection Time: 05/12/18  9:10 AM  Result Value Ref Range   ABO/RH(D) O POS    Antibody Screen NEG    Sample Expiration 05/26/2018    Extend sample reason      NO TRANSFUSIONS OR PREGNANCY IN THE PAST 3 MONTHS Performed at [facility], [address] [place], [place] [address]     Assessment: Patient Active Problem List   Diagnosis Date Noted  . Fibroid uterus 05/15/2018    Plan: Patient will undergo surgical management with [facility], BS, possible open.   The risks of surgery were discussed in detail with the patient including but not limited to: bleeding which may require transfusion or reoperation; infection which may require antibiotics; injury to surrounding organs which may involve bowel, bladder, ureters ; need for additional procedures including laparoscopy or laparotomy; thromboembolic phenomenon, surgical site problems and other postoperative/anesthesia complications. Likelihood of success in alleviating the patient's condition was discussed. Routine postoperative instructions will be reviewed with the patient and her family in detail after surgery.  The patient concurred with the proposed plan, giving informed written consent for the surgery.  Patient has been NPO since last night she will remain NPO for procedure.  Anesthesia and OR aware.  Preoperative prophylactic antibiotics and SCDs ordered on call to the OR.  To OR when ready.  ----- Larey Days, MD Attending Obstetrician and Gynecologist [facility], Department of [facility]

## 2018-05-16 ENCOUNTER — Encounter: Payer: Self-pay | Admitting: Obstetrics & Gynecology

## 2018-05-16 LAB — BASIC METABOLIC PANEL
Anion gap: 4 — ABNORMAL LOW (ref 5–15)
BUN: 6 mg/dL (ref 6–20)
CO2: 26 mmol/L (ref 22–32)
Calcium: 7.7 mg/dL — ABNORMAL LOW (ref 8.9–10.3)
Chloride: 110 mmol/L (ref 98–111)
Creatinine, Ser: 0.77 mg/dL (ref 0.44–1.00)
GFR calc Af Amer: >60 mL/min (ref >60)
GFR calc non Af Amer: >60 mL/min (ref >60)
Glucose, Bld: 103 mg/dL — ABNORMAL HIGH (ref 70–99)
Potassium: 3.9 mmol/L (ref 3.5–5.1)
SODIUM: 140 mmol/L (ref 135–145)

## 2018-05-16 LAB — CBC
HCT: 37.9 % (ref 36.0–46.0)
Hemoglobin: 12.5 g/dL (ref 12.0–15.0)
MCH: 29.2 pg (ref 26.0–34.0)
MCHC: 33 g/dL (ref 30.0–36.0)
MCV: 88.6 fL (ref 80.0–100.0)
NRBC: 0 % (ref 0.0–0.2)
Platelets: 264 10*3/uL (ref 150–400)
RBC: 4.28 MIL/uL (ref 3.87–5.11)
RDW: 12.5 % (ref 11.5–15.5)
WBC: 11.5 10*3/uL — ABNORMAL HIGH (ref 4.0–10.5)

## 2018-05-16 MED ORDER — PANTOPRAZOLE SODIUM 40 MG PO TBEC
40.0000 mg | DELAYED_RELEASE_TABLET | Freq: Every day | ORAL | Status: DC
  Administered 2018-05-16: 40 mg via ORAL
  Filled 2018-05-16: qty 1

## 2018-05-16 MED ORDER — OXYCODONE HCL 5 MG PO TABS: 5.0000 mg | ORAL_TABLET | ORAL | 0 refills | Status: DC | PRN

## 2018-05-16 MED ORDER — PNEUMOCOCCAL VAC POLYVALENT 25 MCG/0.5ML IJ INJ: 0.5000 mL | INJECTION | INTRAMUSCULAR | Status: DC

## 2018-05-16 MED ORDER — IBUPROFEN 600 MG PO TABS: 600.0000 mg | ORAL_TABLET | Freq: Four times a day (QID) | ORAL | 1 refills | Status: AC

## 2018-05-16 MED ORDER — ACETAMINOPHEN 500 MG PO TABS: 1000.0000 mg | ORAL_TABLET | Freq: Four times a day (QID) | ORAL | 0 refills | Status: AC

## 2018-05-16 MED ORDER — PNEUMOCOCCAL VAC POLYVALENT 25 MCG/0.5ML IJ INJ
0.5000 mL | INJECTION | INTRAMUSCULAR | Status: AC
  Administered 2018-05-16: 0.5 mL via INTRAMUSCULAR
  Filled 2018-05-16 (×3): qty 0.5

## 2018-05-16 NOTE — Discharge Summary (Signed)
Gynecology Physician Postoperative Discharge Summary  Patient ID: Margaret Bradley MRN: [id number] DOB/AGE: Oct 03, 1980 37 y.o.  Admit Date: 05/15/2018 Discharge Date: 05/16/2018  Preoperative Diagnoses: symptomatic large fibroid uterus Postoperative diagnoses: same  Procedures: Procedure(s) (LRB): ROBOTIC ASSISTED SUPRACERVICAL HYSTERECTOMY WITH BILATERAL SALPINGECTOMY (Bilateral)  And manual morcellation  CBC Latest Ref Rng & Units 05/16/2018 05/12/2018  WBC 4.0 - 10.5 K/uL 11.5(H) 9.0  Hemoglobin 12.0 - 15.0 g/dL 12.5 14.0  Hematocrit 36.0 - 46.0 % 37.9 43.0  Platelets 150 - 400 K/uL 264 315    Hospital Course:  BRENLYN BESHARA is a 37 y.o. admitted for scheduled surgery.  She underwent the procedures as mentioned above, her operation was uncomplicated. For further details about surgery, please refer to the operative report. Patient had an uncomplicated postoperative course. By time of discharge on POD#1, her pain was controlled on oral pain medications; she was ambulating, voiding without difficulty, tolerating regular diet and passing flatus. She was deemed stable for discharge to home.   Discharge Exam: Blood pressure (!) 93/57, pulse 74, temperature 98 F (36.7 C), resp. rate 20, height 5\' 8"  (1.727 m), weight 73.9 kg, last menstrual period 04/18/2018, SpO2 98 %. General appearance: alert and no distress  Resp: clear to auscultation bilaterally, normal respiratory effort Cardio: regular rate and rhythm  GI: soft, non-tender; bowel sounds normal; no masses, no organomegaly.  Incisions: C/D/I, no erythema, no drainage noted Pelvic: no blood on pad  Extremities: extremities normal, atraumatic, no cyanosis or edema and Homans sign is negative, no sign of DVT  Discharged Condition: Stable  Disposition:    Allergies as of 05/16/2018   No Known Allergies     Medication List    STOP taking these medications   oxyCODONE-acetaminophen 10-325 MG tablet Commonly known as:   PERCOCET   YAZ 3-0.02 MG tablet Generic drug:  drospirenone-ethinyl estradiol     TAKE these medications   acetaminophen 500 MG tablet Commonly known as:  TYLENOL Take 2 tablets (1,000 mg total) by mouth every 6 (six) hours.   ibuprofen 600 MG tablet Commonly known as:  ADVIL,MOTRIN Take 1 tablet (600 mg total) by mouth every 6 (six) hours. What changed:    medication strength  when to take this  reasons to take this   oxyCODONE 5 MG immediate release tablet Commonly known as:  Oxy IR/ROXICODONE Take 1 tablet (5 mg total) by mouth every 4 (four) hours as needed for moderate pain.   oxymetazoline 0.05 % nasal spray Commonly known as:  AFRIN Place 1 spray into both nostrils daily as needed for congestion.   vitamin C 500 MG tablet Commonly known as:  ASCORBIC ACID Take 500 mg by mouth as needed (cold).        Signed:  [place] Attending [facility]

## 2018-05-16 NOTE — Discharge Instructions (Signed)
Discharge instructions:  Call office if you have any of the following: fever >101 F, chills, shortness of breath, excessive vaginal bleeding, incision drainage or problems, leg pain or redness, or any other concerns.   Activity: Do not lift > 10 lbs for 4 weeks.   No driving for 1-2 weeks, OR until you feel you are able to slam on the brakes in an emergency response.   You may feel some pain in your upper right abdomen/rib and right shoulder.  This is from the gas in the abdomen for surgery. This will subside over time, please be patient!  Take 600mg  Ibuprofen and 1000mg  Tylenol around the clock, every 6 hours for at least the first 3-5 days.  After this you can take as needed.  This will help decrease inflammation and promote healing.  The narcotics you'll take just as needed, as they just trick your brain into thinking its not in pain.    Please don't limit yourself in terms of routine activity.  You will be able to do most things, although they may take longer to do or be a little painful.  You can do it!  Don't be a hero, but don't be a wimp either!   No intercourse, tampons, or douching for 6 weeks.  No tub baths- showers only.

## 2018-05-16 NOTE — Progress Notes (Signed)
Discharge instructions provided.  Pt and sig other verbalize understanding of all instructions and follow-up care.  Pt discharged to home at 1312 on 05/16/18 via wheelchair by RN. Reed Breech, RN 05/16/2018 1:23 PM

## 2018-05-18 LAB — SURGICAL PATHOLOGY

## 2019-09-24 ENCOUNTER — Ambulatory Visit: Payer: Managed Care, Other (non HMO) | Attending: Internal Medicine

## 2019-09-24 DIAGNOSIS — Z23 Encounter for immunization: Secondary | ICD-10-CM

## 2019-09-24 NOTE — Progress Notes (Signed)
   Covid-19 Vaccination Clinic  Name:  HANIYYAH TWARDZIK    MRN: [id number] DOB: Feb 24, 1981  09/24/2019  Ms. Klinkhammer was observed post Covid-19 immunization for 15 minutes without incident. She was provided with Vaccine Information Sheet and instruction to access the V-Safe system.   Ms. Swerdlow was instructed to call 911 with any severe reactions post vaccine: Marland Kitchen Difficulty breathing  . Swelling of face and throat  . A fast heartbeat  . A bad rash all over body  . Dizziness and weakness   Immunizations Administered    Name Date Dose VIS Date Route   Pfizer COVID-19 Vaccine 09/24/2019  9:49 AM 0.3 mL 05/21/2019 Intramuscular   Manufacturer: [place]   Lot: [id number]   [place]: [id number]

## 2019-10-19 ENCOUNTER — Ambulatory Visit: Payer: Managed Care, Other (non HMO) | Attending: Internal Medicine

## 2019-10-19 DIAGNOSIS — Z23 Encounter for immunization: Secondary | ICD-10-CM

## 2019-10-19 NOTE — Progress Notes (Signed)
   Covid-19 Vaccination Clinic  Name:  Margaret Bradley    MRN: [id number] DOB: Oct 07, 1980  10/19/2019  [name] was observed post Covid-19 immunization for 15 minutes without incident. She was provided with Vaccine Information Sheet and instruction to access the V-Safe system.   [name] was instructed to call 911 with any severe reactions post vaccine: Marland Kitchen Difficulty breathing  . Swelling of face and throat  . A fast heartbeat  . A bad rash all over body  . Dizziness and weakness   Immunizations Administered    Name Date Dose VIS Date Route   Pfizer COVID-19 Vaccine 10/19/2019  1:58 PM 0.3 mL 08/04/2018 Intramuscular   Manufacturer: [place]   Lot: [id number]   [place]: [id number]

## 2019-11-22 ENCOUNTER — Other Ambulatory Visit: Payer: Self-pay | Admitting: Obstetrics & Gynecology

## 2019-11-22 DIAGNOSIS — Z1231 Encounter for screening mammogram for malignant neoplasm of breast: Secondary | ICD-10-CM

## 2019-12-01 ENCOUNTER — Ambulatory Visit
Admission: RE | Admit: 2019-12-01 | Discharge: 2019-12-01 | Disposition: A | Discharge status: Home / Self Care | Payer: Managed Care, Other (non HMO) | Source: Ambulatory Visit | Attending: Obstetrics & Gynecology | Admitting: Obstetrics & Gynecology

## 2019-12-01 ENCOUNTER — Encounter (INDEPENDENT_AMBULATORY_CARE_PROVIDER_SITE_OTHER): Payer: Self-pay

## 2019-12-01 ENCOUNTER — Other Ambulatory Visit: Payer: Self-pay

## 2019-12-01 DIAGNOSIS — Z1231 Encounter for screening mammogram for malignant neoplasm of breast: Secondary | ICD-10-CM | POA: Diagnosis not present

## 2020-07-14 IMAGING — MG DIGITAL SCREENING BILAT W/ TOMO W/ CAD
8 series · 8 of 24 positions shown · non-contrast
Comparison: Previous exam(s).

CLINICAL DATA: Screening.

EXAM:
DIGITAL SCREENING BILATERAL MAMMOGRAM WITH TOMO AND CAD

[R MLO synth-2D]
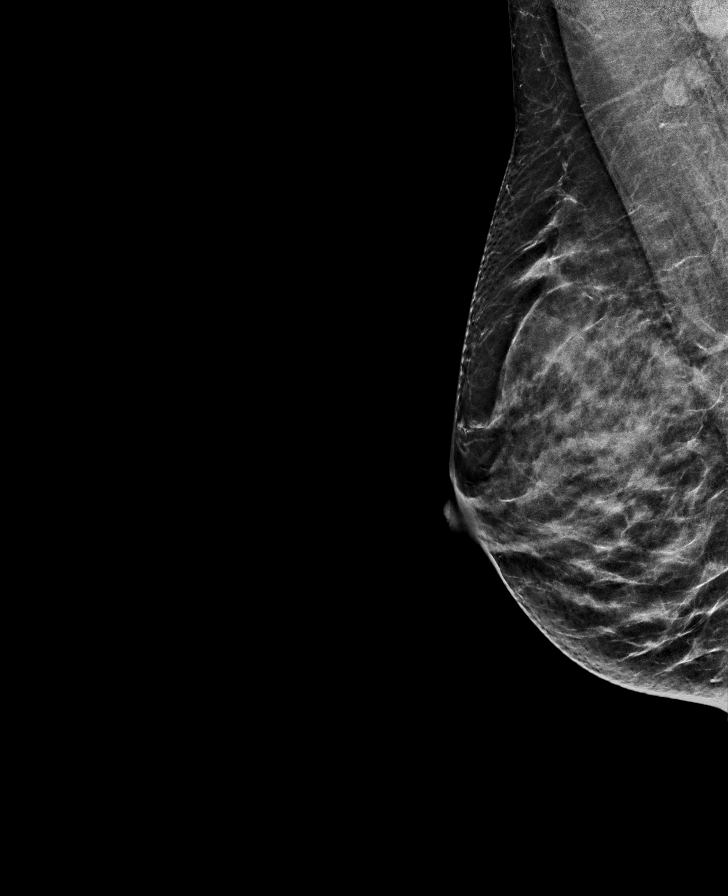

[R CC synth-2D]
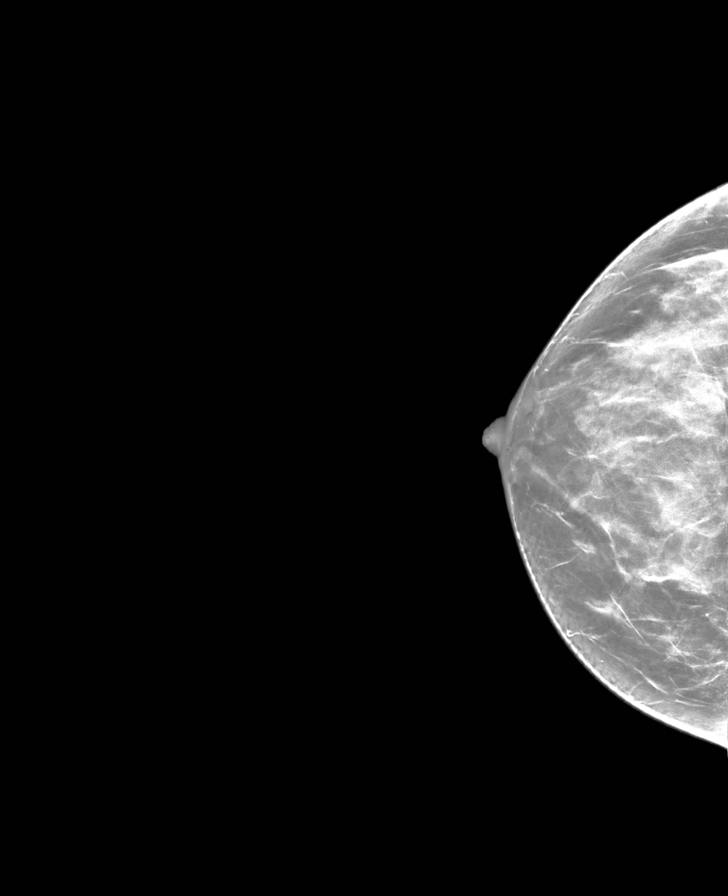

[L MLO synth-2D]
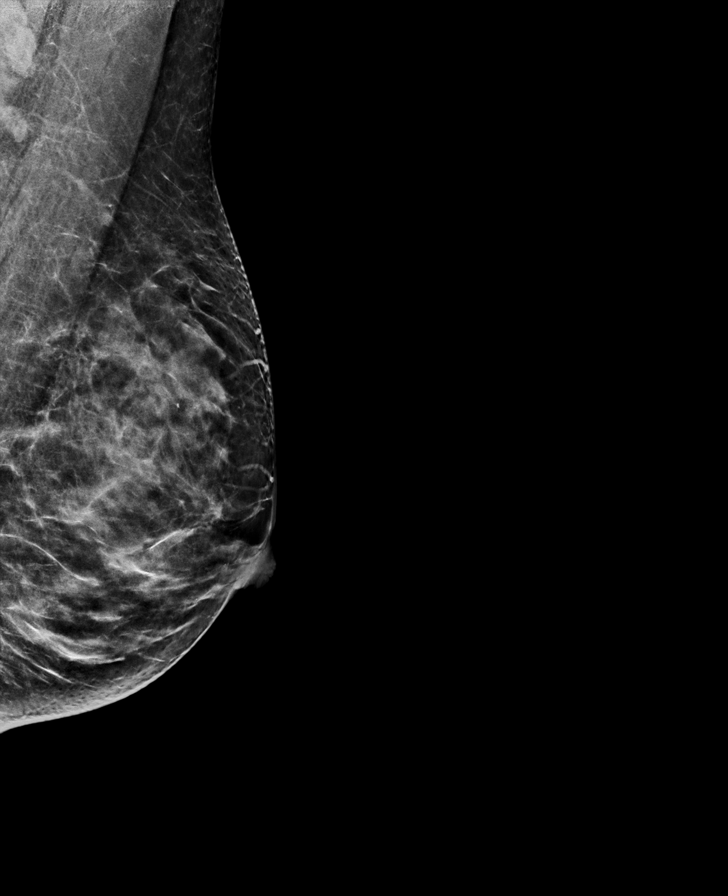

[L CC synth-2D]
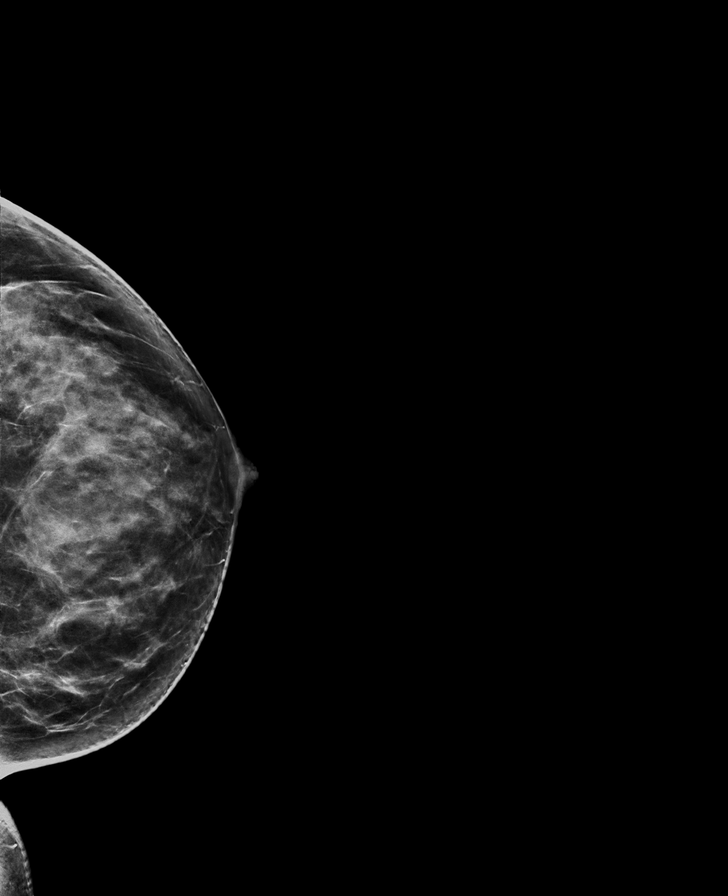

[R CC tomo · tomo slice 36/71.0]
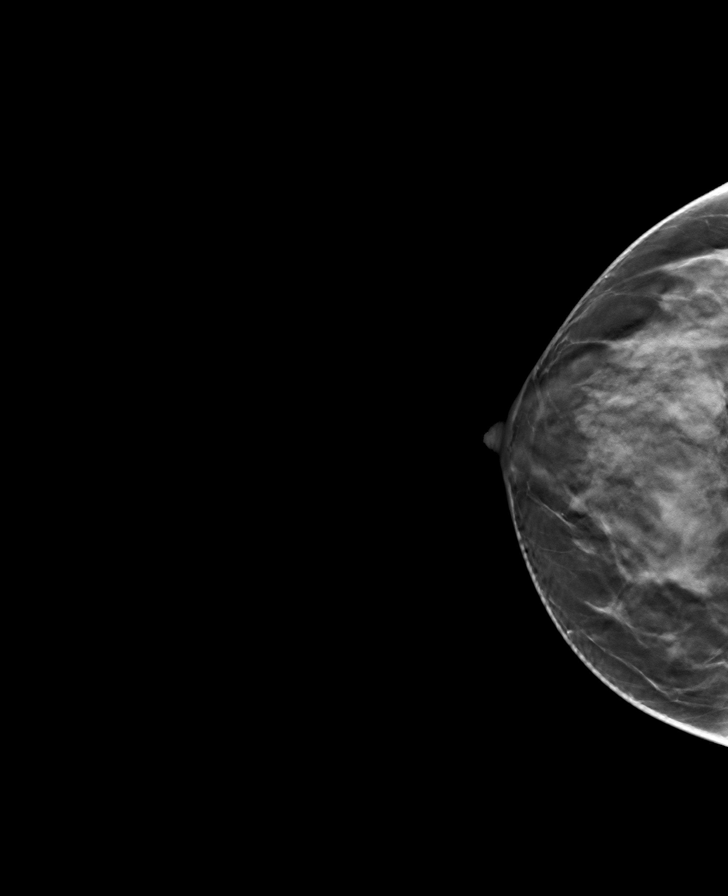

[R MLO tomo · tomo slice 33/65.0]
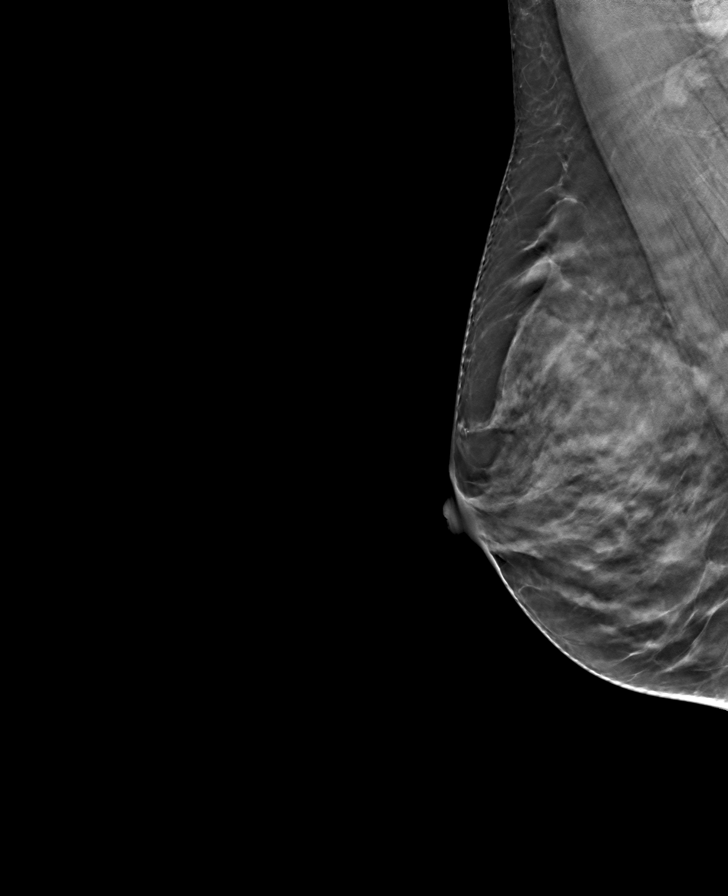

[L MLO tomo · tomo slice 33/66.0]
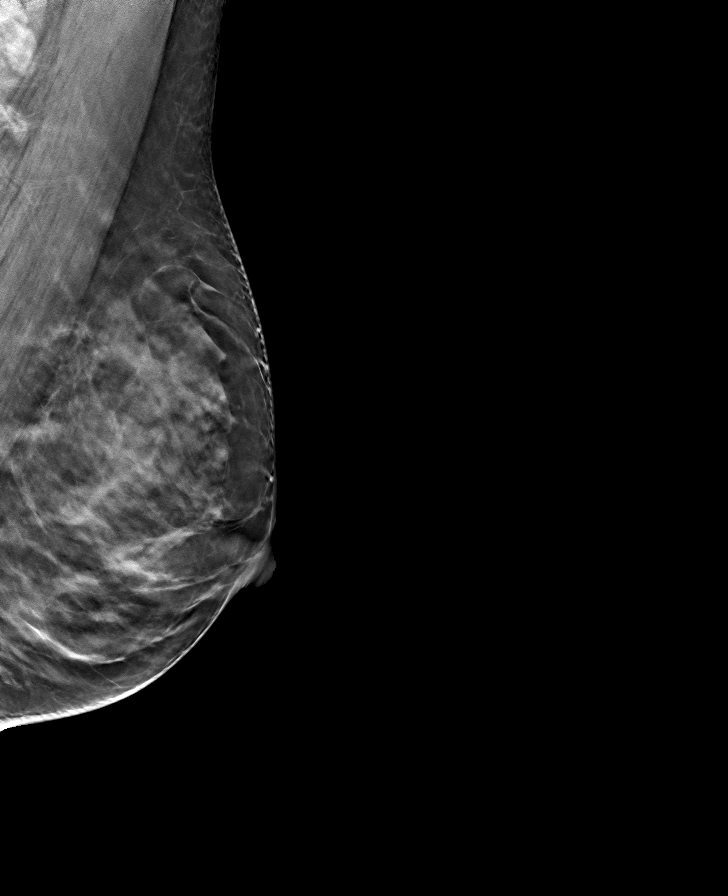

[L CC tomo · tomo slice 36/71.0]
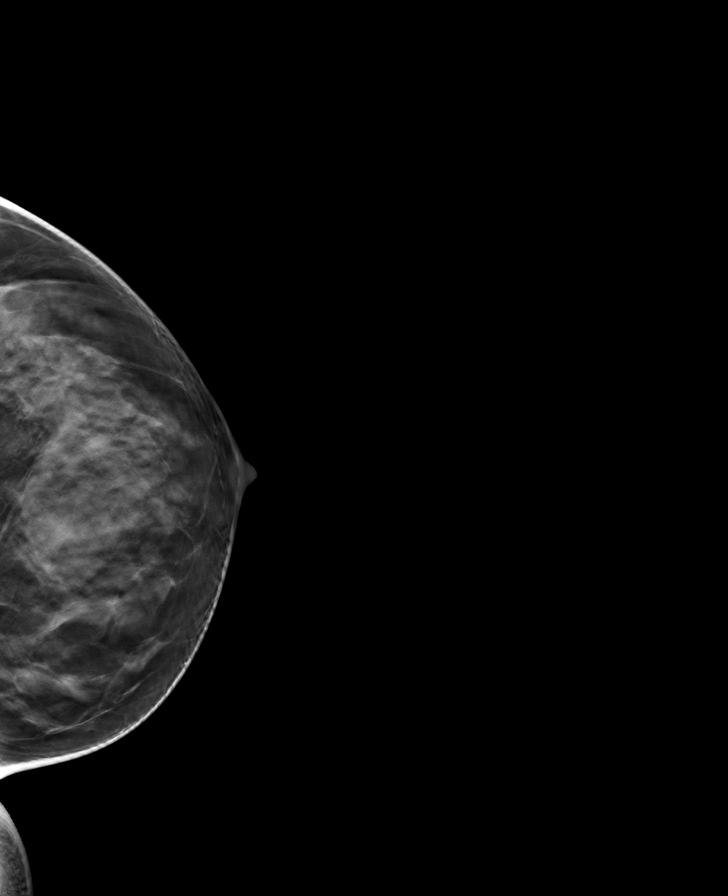

[8 of 24 positions shown; findings below may reference images not displayed]

ACR Breast Density Category c: The breast tissue is heterogeneously
dense, which may obscure small masses.
FINDINGS: There are no findings suspicious for malignancy. Images were
processed with CAD.
IMPRESSION: No mammographic evidence of malignancy. A result letter of this
screening mammogram will be mailed directly to the patient.

RECOMMENDATION:
Screening mammogram in one year. (Code:[id number])

BI-RADS CATEGORY  1: Negative.

## 2020-10-18 ENCOUNTER — Ambulatory Visit (INDEPENDENT_AMBULATORY_CARE_PROVIDER_SITE_OTHER): Payer: Managed Care, Other (non HMO)

## 2020-10-18 ENCOUNTER — Other Ambulatory Visit: Payer: Self-pay

## 2020-10-18 ENCOUNTER — Ambulatory Visit
Admission: EM | Admit: 2020-10-18 | Discharge: 2020-10-18 | Disposition: A | Discharge status: Home / Self Care | Payer: Managed Care, Other (non HMO) | Attending: Emergency Medicine | Admitting: Emergency Medicine

## 2020-10-18 DIAGNOSIS — R0689 Other abnormalities of breathing: Secondary | ICD-10-CM

## 2020-10-18 DIAGNOSIS — R0602 Shortness of breath: Secondary | ICD-10-CM | POA: Diagnosis not present

## 2020-10-18 DIAGNOSIS — U071 COVID-19: Secondary | ICD-10-CM | POA: Diagnosis not present

## 2020-10-18 MED ORDER — BENZONATATE 100 MG PO CAPS: 200.0000 mg | ORAL_CAPSULE | Freq: Three times a day (TID) | ORAL | 0 refills | Status: AC

## 2020-10-18 MED ORDER — ALBUTEROL SULFATE HFA 108 (90 BASE) MCG/ACT IN AERS: 2.0000 | INHALATION_SPRAY | RESPIRATORY_TRACT | 0 refills | Status: AC | PRN

## 2020-10-18 MED ORDER — AEROCHAMBER MV MISC: 2 refills | Status: AC

## 2020-10-18 MED ORDER — PROMETHAZINE-DM 6.25-15 MG/5ML PO SYRP: 5.0000 mL | ORAL_SOLUTION | Freq: Four times a day (QID) | ORAL | 0 refills | Status: AC | PRN

## 2020-10-18 NOTE — Discharge Instructions (Addendum)
Continue your 5 days of quarantine at home for your COVID-19 infection.  After the 5 days you can break quarantine if your symptoms have improved and you have not had a fever for 24 hours without taking Tylenol and ibuprofen.  Use over-the-counter Tylenol and ibuprofen as needed for body aches and fever.  Use the albuterol inhaler with a spacer, 2 puffs every 4-6 hours, as needed for shortness of breath and wheezing.  Use the Tessalon Perles during the day as needed for cough and the Promethazine DM cough syrup at nighttime as will make you drowsy.  If you develop any increased shortness of breath-especially at rest, you are unable to speak in full sentences, or is a late sign your lips are turning blue you need to go the ER for evaluation.

## 2020-10-18 NOTE — ED Provider Notes (Signed)
[facility]    CSN: [id number] Arrival date & time: 10/18/20  1253      History   Chief Complaint Chief Complaint  Patient presents with  . chest congestion    HPI Margaret Bradley is a 40 y.o. female.   HPI   40 year old female here for evaluation of chest congestion and shortness of breath.  Patient reports that she feels very anxious and like she cannot get enough air in.  She feels a burning in the middle of her chest.  Patient tested positive for COVID 2 days ago.  This is her second round of COVID as her first infection was in October 2020.  She reports that she did not have these shortness of breath symptoms then that she is having now.  She has also since been vaccinated but not boosted.  Patient states that she feels like her breathing is labored, has intermittent wheezing, and has a mostly nonproductive cough with intermittent clear to green sputum production.  Past Medical History:  Diagnosis Date  . Anxiety    OCC- HAS XANAX BUT HAS NEVER TAKEN  . Complication of anesthesia    PT WAS AGITATED DURING EGG RETRIEVAL   . GERD (gastroesophageal reflux disease)    occ-no meds    Patient Active Problem List   Diagnosis Date Noted  . Fibroid uterus 05/15/2018  . S/P hysterectomy 05/15/2018    Past Surgical History:  Procedure Laterality Date  . ABDOMINAL HYSTERECTOMY    . KNEE SURGERY    . OTHER SURGICAL HISTORY     EGG RETRIEVAL  . ROBOTIC ASSISTED TOTAL HYSTERECTOMY WITH SALPINGECTOMY Bilateral 05/15/2018   Procedure: ROBOTIC ASSISTED SUPRACERVICAL HYSTERECTOMY WITH BILATERAL SALPINGECTOMY;  Surgeon: Ward, Honor Loh, MD;  Location: [facility] ORS;  Service: Gynecology;  Laterality: Bilateral;  . THYROIDECTOMY Right 09/02/2017   Procedure: RIGHT HEMI-THYROIDECTOMY;  Surgeon: Beverly Gust, MD;  Location: [facility] OR[facility];  Service: ENT;  Laterality: Right;    OB History    Gravida  1   Para      Term      Preterm      AB      Living        SAB       IAB      Ectopic      Multiple      Live Births               Home Medications    Prior to Admission medications   Medication Sig Start Date End Date Taking? Authorizing Provider  acetaminophen (TYLENOL) 500 MG tablet Take 2 tablets (1,000 mg total) by mouth every 6 (six) hours. 05/16/18  Yes Ward, Honor Loh, MD  albuterol (VENTOLIN HFA) 108 (90 Base) MCG/ACT inhaler Inhale 2 puffs into the lungs every 4 (four) hours as needed. 10/18/20  Yes Margarette Canada, NP  benzonatate (TESSALON) 100 MG capsule Take 2 capsules (200 mg total) by mouth every 8 (eight) hours. 10/18/20  Yes Margarette Canada, NP  ibuprofen (ADVIL,MOTRIN) 600 MG tablet Take 1 tablet (600 mg total) by mouth every 6 (six) hours. 05/16/18  Yes Ward, Honor Loh, MD  oxymetazoline (AFRIN) 0.05 % nasal spray Place 1 spray into both nostrils daily as needed for congestion.   Yes [provider]  promethazine-dextromethorphan (PROMETHAZINE-DM) 6.25-15 MG/5ML syrup Take 5 mLs by mouth 4 (four) times daily as needed. 10/18/20  Yes Margarette Canada, NP  Spacer/Aero-Holding Chambers (AEROCHAMBER MV) inhaler Use as instructed 10/18/20  Yes  Margarette Canada, NP  vitamin C (ASCORBIC ACID) 500 MG tablet Take 500 mg by mouth as needed (cold).    Yes [provider]    Family History Family History  Problem Relation Age of Onset  . Breast cancer Neg Hx     Social History Social History   Tobacco Use  . Smoking status: Former Smoker    Packs/day: 0.25    Years: 8.00    Pack years: 2.00    Types: Cigarettes    Quit date: 04/21/2016    Years since quitting: 4.4  . Smokeless tobacco: Never Used  Vaping Use  . Vaping Use: Some days  . Substances: CBD  Substance Use Topics  . Alcohol use: Yes    Comment: beer occ  . Drug use: Yes    Types: Marijuana    Comment: [place]     Allergies   Patient has no known allergies.   Review of Systems Review of Systems  Constitutional: Negative for activity change, appetite change and  fever.  Respiratory: Positive for cough, shortness of breath and wheezing.   Cardiovascular: Negative for chest pain.  Skin: Negative for rash.  Hematological: Negative.   Psychiatric/Behavioral: Negative.      Physical Exam Triage Vital Signs ED Triage Vitals  Enc Vitals Group     BP 10/18/20 1327 110/76     Pulse Rate 10/18/20 1327 69     Resp 10/18/20 1327 18     Temp 10/18/20 1327 99 F (37.2 C)     Temp Source 10/18/20 1327 Oral     SpO2 10/18/20 1327 100 %     Weight 10/18/20 1325 153 lb (69.4 kg)     Height 10/18/20 1325 5\' 9"  (1.753 m)     Head Circumference --      Peak Flow --      Pain Score 10/18/20 1325 0     Pain Loc --      Pain Edu? --      Excl. in [place]? --    No data found.  Updated Vital Signs BP 110/76 (BP Location: Left Arm)   Pulse 69   Temp 99 F (37.2 C) (Oral)   Resp 18   Ht 5\' 9"  (1.753 m)   Wt 153 lb (69.4 kg)   LMP 04/18/2018 (Approximate)   SpO2 100%   BMI 22.59 kg/m   Visual Acuity Right Eye Distance:   Left Eye Distance:   Bilateral Distance:    Right Eye Near:   Left Eye Near:    Bilateral Near:     Physical Exam Vitals and nursing note reviewed.  Constitutional:      General: She is not in acute distress.    Appearance: Normal appearance. She is normal weight. She is not ill-appearing or toxic-appearing.  HENT:     Head: Normocephalic and atraumatic.  Cardiovascular:     Rate and Rhythm: Normal rate and regular rhythm.     Pulses: Normal pulses.     Heart sounds: Normal heart sounds. No murmur heard.   Pulmonary:     Effort: Pulmonary effort is normal.     Breath sounds: Normal breath sounds. No wheezing, rhonchi or rales.  Skin:    General: Skin is warm and dry.     Capillary Refill: Capillary refill takes less than 2 seconds.     Findings: No erythema or rash.  Neurological:     General: No focal deficit present.     Mental Status:  She is alert and oriented to person, place, and time.  Psychiatric:         Mood and Affect: Mood normal.        Behavior: Behavior normal.        Thought Content: Thought content normal.        Judgment: Judgment normal.      UC Treatments / Results  Labs (all labs ordered are listed, but only abnormal results are displayed) Labs Reviewed - No data to display  EKG   Radiology DG Chest 2 View  Result Date: 10/18/2020 CLINICAL DATA:  sob, chest congestion and labored breathing EXAM: CHEST - 2 VIEW COMPARISON:  None. FINDINGS: The heart size and mediastinal contours are within normal limits. Both lungs are clear. The visualized skeletal structures are unremarkable. IMPRESSION: No evidence of acute cardiopulmonary disease. Electronically Signed   By: Maurine Simmering   On: 10/18/2020 14:21    Procedures Procedures (including critical care time)  Medications Ordered in UC Medications - No data to display  Initial Impression / Assessment and Plan / UC Course  I have reviewed the triage vital signs and the nursing notes.  Pertinent labs & imaging results that were available during my care of the patient were reviewed by me and considered in my medical decision making (see chart for details).   Patient is a very pleasant, though anxious 39 year old female here for evaluation of shortness of breath and which she feels is labored breathing.  She is also complaining of burning in the middle of her chest and some intermittent wheezing.  She does have a cough that will occasionally produce some sputum if she coughs with force.  The sputum can anywhere from clear to green in color.  Patient reports that her symptoms are worse in the evening.  Patient's physical exam reveals a nontoxic-appearing female in no respiratory distress.  There is no accessory muscle use or belly breathing present.  Patient can speak in full sentences without becoming dyspneic or out of breath.  Patient's cardiopulmonary exam is benign.  There is no rales, wheezes, or rhonchi present in any of her  lung fields.  Patient is concerned because she has a history of smoking as well as vaping and this is her second round of COVID.  Her family member passed away as a result of COVID and her grandmother developed a pneumonitis.  Will obtain chest x-ray.  Very reviewed and independently evaluated by me.  Interpretation: No evidence of pneumonia, effusion, or pneumothorax.  Awaiting radiology overread.  Radiology interpretation is negative for acute intrathoracic process.  Will discharge patient home with an albuterol inhaler and spacer to help with wheezing and shortness of breath as well as Tessalon Perles and Promethazine DM cough syrup to be used as needed.  ER precautions reviewed with patient.   Final Clinical Impressions(s) / UC Diagnoses   Final diagnoses:  [id number]     Discharge Instructions     Continue your 5 days of quarantine at home for your COVID-19 infection.  After the 5 days you can break quarantine if your symptoms have improved and you have not had a fever for 24 hours without taking Tylenol and ibuprofen.  Use over-the-counter Tylenol and ibuprofen as needed for body aches and fever.  Use the albuterol inhaler with a spacer, 2 puffs every 4-6 hours, as needed for shortness of breath and wheezing.  Use the Tessalon Perles during the day as needed for cough and the Promethazine DM cough  syrup at nighttime as will make you drowsy.  If you develop any increased shortness of breath-especially at rest, you are unable to speak in full sentences, or is a late sign your lips are turning blue you need to go the ER for evaluation.     ED Prescriptions    Medication Sig Dispense Auth. Provider   albuterol (VENTOLIN HFA) 108 (90 Base) MCG/ACT inhaler Inhale 2 puffs into the lungs every 4 (four) hours as needed. 18 g Margarette Canada, NP   Spacer/Aero-Holding Chambers (AEROCHAMBER MV) inhaler Use as instructed 1 each Margarette Canada, NP   benzonatate (TESSALON) 100 MG capsule Take 2  capsules (200 mg total) by mouth every 8 (eight) hours. 21 capsule Margarette Canada, NP   promethazine-dextromethorphan (PROMETHAZINE-DM) 6.25-15 MG/5ML syrup Take 5 mLs by mouth 4 (four) times daily as needed. 118 mL Margarette Canada, NP     PDMP not reviewed this encounter.   Margarette Canada, NP 10/18/20 1429

## 2020-10-18 NOTE — ED Triage Notes (Signed)
Pt c/o chest congestion and labored breathing. Pt also reports a burning sensation in the middle of her chest. Pt states it is hard to catch her breath. Pt tested positive for COVID on 10/16/20. Pt states she "doesn't feel right".

## 2021-01-23 ENCOUNTER — Other Ambulatory Visit: Payer: Self-pay

## 2021-01-23 DIAGNOSIS — Z1231 Encounter for screening mammogram for malignant neoplasm of breast: Secondary | ICD-10-CM

## 2021-02-27 ENCOUNTER — Ambulatory Visit
Admission: RE | Admit: 2021-02-27 | Discharge: 2021-02-27 | Disposition: A | Discharge status: Home / Self Care | Payer: Managed Care, Other (non HMO) | Source: Ambulatory Visit

## 2021-02-27 ENCOUNTER — Other Ambulatory Visit: Payer: Self-pay

## 2021-02-27 DIAGNOSIS — Z1231 Encounter for screening mammogram for malignant neoplasm of breast: Secondary | ICD-10-CM | POA: Diagnosis not present

## 2022-01-31 ENCOUNTER — Other Ambulatory Visit (HOSPITAL_COMMUNITY): Payer: Self-pay | Admitting: General Surgery

## 2022-01-31 ENCOUNTER — Other Ambulatory Visit: Payer: Self-pay | Admitting: General Surgery

## 2022-01-31 DIAGNOSIS — R1031 Right lower quadrant pain: Secondary | ICD-10-CM

## 2022-01-31 DIAGNOSIS — R1032 Left lower quadrant pain: Secondary | ICD-10-CM

## 2022-02-01 ENCOUNTER — Other Ambulatory Visit: Payer: Self-pay

## 2022-02-01 DIAGNOSIS — Z1231 Encounter for screening mammogram for malignant neoplasm of breast: Secondary | ICD-10-CM

## 2022-03-12 ENCOUNTER — Ambulatory Visit
Admission: RE | Admit: 2022-03-12 | Discharge: 2022-03-12 | Disposition: A | Discharge status: Home / Self Care | Payer: BC Managed Care – PPO | Source: Ambulatory Visit

## 2022-03-12 DIAGNOSIS — Z1231 Encounter for screening mammogram for malignant neoplasm of breast: Secondary | ICD-10-CM | POA: Insufficient documentation

## 2023-01-07 ENCOUNTER — Other Ambulatory Visit: Payer: Self-pay | Admitting: Family Medicine

## 2023-01-07 DIAGNOSIS — R1031 Right lower quadrant pain: Secondary | ICD-10-CM

## 2023-01-09 ENCOUNTER — Ambulatory Visit: Admission: RE | Admit: 2023-01-09 | Payer: BC Managed Care – PPO | Source: Ambulatory Visit

## 2023-01-10 ENCOUNTER — Ambulatory Visit
Admission: RE | Admit: 2023-01-10 | Discharge: 2023-01-10 | Disposition: A | Discharge status: Home / Self Care | Payer: BC Managed Care – PPO | Source: Ambulatory Visit | Attending: Family Medicine | Admitting: Family Medicine

## 2023-01-10 ENCOUNTER — Other Ambulatory Visit: Payer: Self-pay | Admitting: Family Medicine

## 2023-01-10 DIAGNOSIS — R1031 Right lower quadrant pain: Secondary | ICD-10-CM

## 2023-01-10 DIAGNOSIS — R1032 Left lower quadrant pain: Secondary | ICD-10-CM | POA: Insufficient documentation

## 2023-01-27 ENCOUNTER — Other Ambulatory Visit: Payer: Self-pay

## 2023-01-27 DIAGNOSIS — Z1231 Encounter for screening mammogram for malignant neoplasm of breast: Secondary | ICD-10-CM

## 2023-03-17 ENCOUNTER — Ambulatory Visit
Admission: RE | Admit: 2023-03-17 | Discharge: 2023-03-17 | Disposition: A | Discharge status: Home / Self Care | Payer: BC Managed Care – PPO | Source: Ambulatory Visit

## 2023-03-17 DIAGNOSIS — Z1231 Encounter for screening mammogram for malignant neoplasm of breast: Secondary | ICD-10-CM | POA: Diagnosis present

## 2023-04-01 ENCOUNTER — Other Ambulatory Visit: Payer: Self-pay | Admitting: Family Medicine

## 2023-04-01 DIAGNOSIS — Z0000 Encounter for general adult medical examination without abnormal findings: Secondary | ICD-10-CM

## 2023-04-01 DIAGNOSIS — R1032 Left lower quadrant pain: Secondary | ICD-10-CM

## 2024-01-29 ENCOUNTER — Other Ambulatory Visit: Payer: Self-pay

## 2024-01-29 DIAGNOSIS — Z1231 Encounter for screening mammogram for malignant neoplasm of breast: Secondary | ICD-10-CM

## 2024-04-08 ENCOUNTER — Ambulatory Visit

## 2024-04-08 DIAGNOSIS — L578 Other skin changes due to chronic exposure to nonionizing radiation: Secondary | ICD-10-CM

## 2024-04-08 DIAGNOSIS — L710 Perioral dermatitis: Secondary | ICD-10-CM | POA: Diagnosis not present

## 2024-04-08 MED ORDER — METRONIDAZOLE 0.75 % EX GEL: 1.0000 | Freq: Two times a day (BID) | CUTANEOUS | 0 refills | Status: AC

## 2024-04-08 NOTE — Progress Notes (Signed)
    Subjective   Margaret Bradley is a 43 y.o. female who presents for the following: Rash. Patient is new patient  Today patient reports: Rash on the face around her mouth and chin since having poison ivy a few weeks ago. 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 Patient has a hx of atopic derm that is not currently being treated.   Review of Systems:    No other skin or systemic complaints except as noted in HPI or Assessment and Plan.  The following portions of the chart were reviewed this encounter and updated as appropriate: medications, allergies, medical history  Relevant Medical History:  n/a   Objective  Well appearing patient in no apparent distress; mood and affect are within normal limits. 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 Condition is symptomatic and bothersome to patient. Patient is flaring and not currently at treatment goal.  - Explained to patient that its a common facial skin problem in which groups of itchy or tender small red papules (bumps) appear around the mouth. - Recommended patient discontinue applying all face creams including topical steroids, cosmetics - The rash may get worse for a few days before it starts to improve. 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 Condition is symptomatic and bothersome to patient. Patient is flaring and not currently at treatment goal.   Procedures, orders, diagnosis for this visit:    There are no diagnoses linked to this encounter.  Return to clinic: Return in about 6 weeks (around 05/20/2024) for TBSE, periorificial derm.  I, Emerick Ege, CMA am acting as scribe for Lauraine JAYSON Kanaris, MD.   Documentation: I have reviewed the above documentation for accuracy and completeness, and I agree with the above.  Lauraine JAYSON Kanaris, MD

## 2024-04-08 NOTE — Patient Instructions (Addendum)
 Gentle Skin Care Guide  1. Bathe no more than once a day.  2. Avoid bathing in hot water  3. Use a mild soap like Dove, Vanicream, Cetaphil, CeraVe. Can use Lever 2000 or Cetaphil antibacterial soap  4. Use soap only where you need it. On most days, use it under your arms, between your legs, and on your feet. Let the water rinse other areas unless visibly dirty.  5. When you get out of the bath/shower, use a towel to gently blot your skin dry, don't rub it.  6. While your skin is still a little damp, apply a moisturizing cream such as Vanicream, CeraVe, Cetaphil, Eucerin, Sarna lotion or plain Vaseline Jelly. For hands apply Neutrogena [place] Hand Cream or Excipial Hand Cream.  7. Reapply moisturizer any time you start to itch or feel dry.  8. Sometimes using free and clear laundry detergents can be helpful. Fabric softener sheets should be avoided. Downy Free & Gentle liquid, or any liquid fabric softener that is free of dyes and perfumes, it acceptable to use  9. If your doctor has given you prescription creams you may apply moisturizers over them      Due to recent changes in healthcare laws, you may see results of your pathology and/or laboratory studies on MyChart before the doctors have had a chance to review them. We understand that in some cases there may be results that are confusing or concerning to you. Please understand that not all results are received at the same time and often the doctors may need to interpret multiple results in order to provide you with the best plan of care or course of treatment. Therefore, we ask that you please give us  2 business days to thoroughly review all your results before contacting the office for clarification. Should we see a critical lab result, you will be contacted sooner.   If You Need Anything After Your Visit  If you have any questions or concerns for your doctor, please call our main line at [phone] and press option 4 to reach  your doctor's medical assistant. If no one answers, please leave a voicemail as directed and we will return your call as soon as possible. Messages left after 4 pm will be answered the following business day.   You may also send us  a message via MyChart. We typically respond to MyChart messages within 1-2 business days.  For prescription refills, please ask your pharmacy to contact our office. Our fax number is [phone].  If you have an urgent issue when the clinic is closed that cannot wait until the next business day, you can page your doctor at the number below.    Please note that while we do our best to be available for urgent issues outside of office hours, we are not available 24/7.   If you have an urgent issue and are unable to reach us , you may choose to seek medical care at your doctor's office, retail clinic, urgent care center, or emergency room.  If you have a medical emergency, please immediately call 911 or go to the emergency department.  Pager Numbers  - Dr. Hester: [phone]  - Dr. Jackquline: [phone]  - Dr. Claudene: [phone]   - Dr. Raymund: [phone]  In the event of inclement weather, please call our main line at [phone] for an update on the status of any delays or closures.  Dermatology Medication Tips: Please keep the boxes that topical medications come in in order to help  keep track of the instructions about where and how to use these. Pharmacies typically print the medication instructions only on the boxes and not directly on the medication tubes.   If your medication is too expensive, please contact our office at [phone] option 4 or send us  a message through MyChart.   We are unable to tell what your co-pay for medications will be in advance as this is different depending on your insurance coverage. However, we may be able to find a substitute medication at lower cost or fill out paperwork to get insurance to cover a needed medication.    If a prior authorization is required to get your medication covered by your insurance company, please allow us  1-2 business days to complete this process.  Drug prices often vary depending on where the prescription is filled and some pharmacies may offer cheaper prices.  The website www.goodrx.com contains coupons for medications through different pharmacies. The prices here do not account for what the cost may be with help from insurance (it may be cheaper with your insurance), but the website can give you the price if you did not use any insurance.  - You can print the associated coupon and take it with your prescription to the pharmacy.  - You may also stop by our office during regular business hours and pick up a GoodRx coupon card.  - If you need your prescription sent electronically to a different pharmacy, notify our office through [facility] or by phone at [phone] option 4.     Si Usted Necesita Algo Despus de Su Visita  Tambin puede enviarnos un mensaje a travs de Clinical cytogeneticist. Por lo general respondemos a los mensajes de MyChart en el transcurso de 1 a 2 das hbiles.  Para renovar recetas, por favor pida a su farmacia que se ponga en contacto con nuestra oficina. Randi lakes de fax es [place] [phone].  Si tiene un asunto urgente cuando la clnica est cerrada y que no puede esperar hasta el siguiente da hbil, puede llamar/localizar a su doctor(a) al nmero que aparece a continuacin.   Por favor, tenga en cuenta que aunque hacemos todo lo posible para estar disponibles para asuntos urgentes fuera del horario de [place], no estamos disponibles las 24 horas del da, los 7 [address]  [address] de la [place].   Si tiene un problema urgente y no puede comunicarse con nosotros, puede optar por buscar atencin mdica  en el consultorio de su doctor(a), en una clnica privada, en un centro de atencin urgente o en una sala de emergencias.  Si tiene Engineer, drilling, por favor  llame inmediatamente al 911 o vaya a la sala de emergencias.  Nmeros de bper  - Dr. Hester: [phone]  - Dra. Jackquline: [phone]  - Dr. Claudene: [phone]  - Dra. Kitts: [phone]  En caso de inclemencias del [place], por favor llame a nuestra lnea principal al [phone] para una actualizacin sobre el estado de cualquier retraso o cierre.  Consejos para la medicacin en dermatologa: Por favor, guarde las cajas en las que vienen los medicamentos de uso tpico para ayudarle a seguir las instrucciones sobre dnde y cmo usarlos. Las farmacias generalmente imprimen las instrucciones del medicamento slo en las cajas y no directamente en los tubos del [place].   Si su medicamento es muy caro, por favor, pngase en contacto con landry rieger llamando al [phone] y presione la opcin 4 o envenos un mensaje a travs de Clinical cytogeneticist.   No podemos decirle cul  ser su copago por los medicamentos por adelantado ya que esto es diferente dependiendo de la cobertura de su seguro. Sin embargo, es posible que podamos encontrar un medicamento sustituto a Audiological scientist un formulario para que el seguro cubra el medicamento que se considera necesario.   Si se requiere una autorizacin previa para que su compaa de seguros malta su medicamento, por favor permtanos de 1 a 2 das hbiles para completar este proceso.  Los precios de los medicamentos varan con frecuencia dependiendo del Environmental consultant de dnde se surte la receta y alguna farmacias pueden ofrecer precios ms baratos.  El sitio web www.goodrx.com tiene cupones para medicamentos de Health and safety inspector. Los precios aqu no tienen en cuenta lo que podra costar con la ayuda del seguro (puede ser ms barato con su seguro), pero el sitio web puede darle el precio si no utiliz Tourist information centre manager.  - Puede imprimir el cupn correspondiente y llevarlo con su receta a la farmacia.  - Tambin puede pasar por nuestra oficina durante el  horario de atencin regular y Education officer, museum una tarjeta de cupones de GoodRx.  - Si necesita que su receta se enve electrnicamente a una farmacia diferente, informe a nuestra oficina a travs de MyChart de [place] o por telfono llamando al [phone] y presione la opcin 4.

## 2024-05-20 ENCOUNTER — Ambulatory Visit

## 2024-05-20 DIAGNOSIS — W908XXA Exposure to other nonionizing radiation, initial encounter: Secondary | ICD-10-CM

## 2024-05-20 DIAGNOSIS — L710 Perioral dermatitis: Secondary | ICD-10-CM | POA: Diagnosis not present

## 2024-05-20 DIAGNOSIS — L988 Other specified disorders of the skin and subcutaneous tissue: Secondary | ICD-10-CM | POA: Diagnosis not present

## 2024-05-20 DIAGNOSIS — L814 Other melanin hyperpigmentation: Secondary | ICD-10-CM

## 2024-05-20 DIAGNOSIS — D1801 Hemangioma of skin and subcutaneous tissue: Secondary | ICD-10-CM

## 2024-05-20 DIAGNOSIS — D229 Melanocytic nevi, unspecified: Secondary | ICD-10-CM

## 2024-05-20 DIAGNOSIS — L578 Other skin changes due to chronic exposure to nonionizing radiation: Secondary | ICD-10-CM | POA: Diagnosis not present

## 2024-05-20 DIAGNOSIS — L821 Other seborrheic keratosis: Secondary | ICD-10-CM | POA: Diagnosis not present

## 2024-05-20 DIAGNOSIS — Z1283 Encounter for screening for malignant neoplasm of skin: Secondary | ICD-10-CM

## 2024-05-20 NOTE — Progress Notes (Signed)
 Subjective   Margaret Bradley is a 43 y.o. female who presents for the following: Total body skin exam for skin cancer screening and mole check. The patient has spots, moles and lesions to be evaluated, some may be new or changing and the patient may have concern these could be cancer.. Patient is established patient   Today patient reports: Pt states that perioral dermatitis has improved with metronidazole  0.75% gel, but does occasionally flare.  Review of Systems:    No other skin or systemic complaints except as noted in HPI or Assessment and Plan.  The following portions of the chart were reviewed this encounter and updated as appropriate: medications, allergies, medical history  Relevant Medical History:  n/a   Objective  (SKPE) Well appearing patient in no apparent distress; mood and affect are within normal limits. Examination was performed of the: Full Skin Examination: scalp, head, eyes, ears, nose, lips, neck, chest, axillae, abdomen, back, buttocks, bilateral upper extremities, bilateral lower extremities, hands, feet, fingers, toes, fingernails, and toenails.   Examination notable for: SKIN EXAM, Angioma(s): Scattered red vascular papule(s)  , Lentigo/lentigines: Scattered pigmented macules that are tan to brown in color and are somewhat non-uniform in shape and concentrated in the sun-exposed areas, Nevus/nevi: Scattered well-demarcated, regular, pigmented macule(s) and/or papule(s)  , Seborrheic Keratosis(es): Stuck-on appearing keratotic papule(s) on the trunk, none  irritated with redness, crusting, edema, and/or partial avulsion, Actinic Damage/Elastosis: chronic sun damage: dyspigmentation, telangiectasia, and wrinkling  Faint pink papules perioral  Examination limited by: n/a    Assessment & Plan  (SKAP)   SKIN CANCER SCREENING PERFORMED TODAY.  BENIGN SKIN FINDINGS  - Lentigines  - Seborrheic keratoses  - Hemangiomas   - Nevus/Multiple Benign Nevi - Reassurance  provided regarding the benign appearance of lesions noted on exam today; no treatment is indicated in the absence of symptoms/changes. - Reinforced importance of photoprotective strategies including liberal and frequent sunscreen use of a broad-spectrum SPF 30 or greater, use of protective clothing, and sun avoidance for prevention of cutaneous malignancy and photoaging.  Counseled patient on the importance of regular self-skin monitoring as well as routine clinical skin examinations as scheduled.   ACTINIC DAMAGE - Chronic condition, secondary to cumulative UV/sun exposure - Recommend daily broad spectrum sunscreen SPF 30+ to sun-exposed areas, reapply every 2 hours as needed.  - Staying in the shade or wearing long sleeves, sun glasses (UVA+UVB protection) and wide brim hats (4-inch brim around the entire circumference of the hat) are also recommended for sun protection.  - Call for new or changing lesions.  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 Condition is symptomatic and bothersome to patient. Patient is flaring and not currently at treatment goal.  - Explained to patient that its a common facial skin problem in which groups of itchy or tender small red papules (bumps) appear around the mouth. - Recommended patient discontinue applying all face creams including topical steroids, cosmetics - The rash may get worse for a few days before it starts to improve. 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 Condition is symptomatic and bothersome to patient. Patient  is flaring and not  currently at treatment goal.     Level of service outlined above   Patient instructions (SKPI)   Procedures, orders, diagnosis for this visit:    There are no diagnoses linked to this encounter.  Return to clinic: Return in about 1 year (around 05/20/2025) for TBSE.  Margaret Bradley, CMA, am acting as scribe for Lauraine JAYSON Kanaris, MD .   Documentation: I have reviewed the above documentation for accuracy and completeness, and I agree with the above.  Lauraine JAYSON Kanaris, MD

## 2024-05-20 NOTE — Patient Instructions (Addendum)

## 2024-06-22 ENCOUNTER — Other Ambulatory Visit: Payer: Self-pay | Admitting: Medical Genetics

## 2024-06-28 ENCOUNTER — Ambulatory Visit: Admission: RE | Admit: 2024-06-28 | Discharge: 2024-06-28 | Disposition: A | Source: Ambulatory Visit

## 2024-06-28 DIAGNOSIS — Z1231 Encounter for screening mammogram for malignant neoplasm of breast: Secondary | ICD-10-CM | POA: Insufficient documentation

## 2025-05-19 ENCOUNTER — Ambulatory Visit
